# Patient Record
Sex: Female | Born: 1985 | Race: White | Hispanic: No | Marital: Single | Smoking: Former smoker
Health system: Southern US, Community
[De-identification: ages and names within clinical notes are randomized; demographics above are authoritative.]

## PROBLEM LIST (undated history)

## (undated) DIAGNOSIS — Z8719 Personal history of other diseases of the digestive system: Secondary | ICD-10-CM

## (undated) DIAGNOSIS — K429 Umbilical hernia without obstruction or gangrene: Secondary | ICD-10-CM

## (undated) HISTORY — PX: HERNIA REPAIR: SHX51

---
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
## 2011-04-11 ENCOUNTER — Emergency Department: Payer: Self-pay | Admitting: Emergency Medicine

## 2012-07-07 DIAGNOSIS — K429 Umbilical hernia without obstruction or gangrene: Secondary | ICD-10-CM

## 2012-07-07 HISTORY — DX: Umbilical hernia without obstruction or gangrene: K42.9

## 2018-11-11 DIAGNOSIS — Z8742 Personal history of other diseases of the female genital tract: Secondary | ICD-10-CM | POA: Insufficient documentation

## 2018-11-11 LAB — HM PAP SMEAR: HM Pap smear: POSITIVE

## 2018-11-11 LAB — HM HIV SCREENING LAB: HM HIV Screening: NEGATIVE

## 2018-11-11 LAB — OB RESULTS CONSOLE HIV ANTIBODY (ROUTINE TESTING): HIV: NONREACTIVE

## 2018-11-12 ENCOUNTER — Other Ambulatory Visit: Payer: Self-pay | Admitting: Family Medicine

## 2018-11-12 DIAGNOSIS — Z3689 Encounter for other specified antenatal screening: Secondary | ICD-10-CM

## 2018-11-12 LAB — OB RESULTS CONSOLE HEPATITIS B SURFACE ANTIGEN: Hepatitis B Surface Ag: NEGATIVE

## 2018-11-12 LAB — OB RESULTS CONSOLE RPR: RPR: NONREACTIVE

## 2018-11-12 LAB — OB RESULTS CONSOLE RUBELLA ANTIBODY, IGM: Rubella: IMMUNE

## 2018-11-12 LAB — OB RESULTS CONSOLE VARICELLA ZOSTER ANTIBODY, IGG: Varicella: IMMUNE

## 2018-11-16 LAB — OB RESULTS CONSOLE GC/CHLAMYDIA
Chlamydia: NEGATIVE
Gonorrhea: NEGATIVE

## 2018-11-22 ENCOUNTER — Other Ambulatory Visit: Payer: Self-pay

## 2018-11-22 DIAGNOSIS — O0993 Supervision of high risk pregnancy, unspecified, third trimester: Secondary | ICD-10-CM

## 2018-11-25 ENCOUNTER — Other Ambulatory Visit: Payer: Self-pay

## 2018-11-25 ENCOUNTER — Other Ambulatory Visit: Payer: Self-pay | Admitting: Family Medicine

## 2018-11-25 ENCOUNTER — Ambulatory Visit
Admission: RE | Admit: 2018-11-25 | Discharge: 2018-11-25 | Disposition: A | Discharge status: Home / Self Care | Payer: Medicaid Other | Source: Ambulatory Visit | Attending: Family Medicine | Admitting: Family Medicine

## 2018-11-25 DIAGNOSIS — Z3689 Encounter for other specified antenatal screening: Secondary | ICD-10-CM | POA: Insufficient documentation

## 2018-11-25 DIAGNOSIS — O0930 Supervision of pregnancy with insufficient antenatal care, unspecified trimester: Secondary | ICD-10-CM

## 2018-11-25 DIAGNOSIS — O0933 Supervision of pregnancy with insufficient antenatal care, third trimester: Secondary | ICD-10-CM | POA: Diagnosis not present

## 2018-11-25 DIAGNOSIS — Z3A36 36 weeks gestation of pregnancy: Secondary | ICD-10-CM | POA: Diagnosis not present

## 2018-12-02 ENCOUNTER — Other Ambulatory Visit: Payer: Self-pay

## 2018-12-02 DIAGNOSIS — O0993 Supervision of high risk pregnancy, unspecified, third trimester: Secondary | ICD-10-CM

## 2018-12-04 LAB — OB RESULTS CONSOLE GBS: GBS: NEGATIVE

## 2018-12-06 ENCOUNTER — Other Ambulatory Visit: Payer: Self-pay

## 2018-12-06 ENCOUNTER — Ambulatory Visit
Admission: RE | Admit: 2018-12-06 | Discharge: 2018-12-06 | Disposition: A | Discharge status: Home / Self Care | Payer: Medicaid Other | Source: Ambulatory Visit | Attending: Obstetrics and Gynecology | Admitting: Obstetrics and Gynecology

## 2018-12-06 DIAGNOSIS — O0993 Supervision of high risk pregnancy, unspecified, third trimester: Secondary | ICD-10-CM | POA: Diagnosis not present

## 2018-12-06 DIAGNOSIS — Z6791 Unspecified blood type, Rh negative: Secondary | ICD-10-CM | POA: Insufficient documentation

## 2018-12-06 DIAGNOSIS — O4103X0 Oligohydramnios, third trimester, not applicable or unspecified: Secondary | ICD-10-CM

## 2018-12-06 DIAGNOSIS — O26899 Other specified pregnancy related conditions, unspecified trimester: Secondary | ICD-10-CM

## 2018-12-06 DIAGNOSIS — Z3A37 37 weeks gestation of pregnancy: Secondary | ICD-10-CM | POA: Insufficient documentation

## 2018-12-06 DIAGNOSIS — O0933 Supervision of pregnancy with insufficient antenatal care, third trimester: Secondary | ICD-10-CM

## 2018-12-06 DIAGNOSIS — O0930 Supervision of pregnancy with insufficient antenatal care, unspecified trimester: Secondary | ICD-10-CM | POA: Insufficient documentation

## 2018-12-06 DIAGNOSIS — O321XX0 Maternal care for breech presentation, not applicable or unspecified: Secondary | ICD-10-CM

## 2018-12-06 NOTE — Progress Notes (Signed)
Pt is 37 6/7 and BREECH  Dates by 36 week scan here Limited prenatal care after her move form [place] - her mother lives here and her daughter is here too ( that baby was a SVD - pt says she was breech and flipped )  Pt has not made contact with an OB provider beyond [facility]  Given low AFI and breech position I recommended she identify a group  this week and have a repeat AFI , if no 2x2 pocket,  consider proceeding with primary cesarean . The nurse assisted her in finding an appointment . Pt reports she is hydrating well an d does not have any fluid leakage  Pos FM  Pt received rhophylac  Other issues advanced paternal age 70 ( he's in [place] but still together per [facility] notes) and h/o marijuana use in [place] if GBS has been done?  Jimmey Ralph

## 2018-12-07 ENCOUNTER — Encounter: Payer: Self-pay | Admitting: Obstetrics and Gynecology

## 2018-12-07 ENCOUNTER — Ambulatory Visit (INDEPENDENT_AMBULATORY_CARE_PROVIDER_SITE_OTHER): Payer: Self-pay | Admitting: Obstetrics and Gynecology

## 2018-12-07 ENCOUNTER — Other Ambulatory Visit: Payer: MEDICAID

## 2018-12-07 VITALS — BP 120/70 | Wt 187.0 lb

## 2018-12-07 DIAGNOSIS — Z3A38 38 weeks gestation of pregnancy: Secondary | ICD-10-CM

## 2018-12-07 DIAGNOSIS — O4100X0 Oligohydramnios, unspecified trimester, not applicable or unspecified: Secondary | ICD-10-CM

## 2018-12-07 DIAGNOSIS — O4103X0 Oligohydramnios, third trimester, not applicable or unspecified: Secondary | ICD-10-CM

## 2018-12-07 DIAGNOSIS — O321XX0 Maternal care for breech presentation, not applicable or unspecified: Secondary | ICD-10-CM

## 2018-12-07 DIAGNOSIS — O0933 Supervision of pregnancy with insufficient antenatal care, third trimester: Secondary | ICD-10-CM

## 2018-12-07 NOTE — Progress Notes (Signed)
[facility] Delivery plan  C/o a lot of pain

## 2018-12-07 NOTE — Progress Notes (Signed)
12/07/2018   Chief Complaint: Missed period  Transfer of Care Patient: yes  History of Present Illness: Madison Bailey is a 33 y.o. G2P0 [redacted]w[redacted]d based on Patient's last menstrual period was 04/13/2018. with an Estimated Date of Delivery: 12/21/18, with the above CC. Patient presents today as a delivery consultation for limited prenatal care, breech presentation and low AFI with sufficient  2x2 pocket.   ROS:  Review of Systems  Constitutional: Negative for chills, fever, malaise/fatigue and weight loss.  HENT: Negative for congestion, hearing loss and sinus pain.   Eyes: Negative for blurred vision and double vision.  Respiratory: Negative for cough, sputum production, shortness of breath and wheezing.   Cardiovascular: Negative for chest pain, palpitations, orthopnea and leg swelling.  Gastrointestinal: Negative for abdominal pain, constipation, diarrhea, nausea and vomiting.  Genitourinary: Negative for dysuria, flank pain, frequency, hematuria and urgency.  Musculoskeletal: Negative for back pain, falls and joint pain.  Skin: Negative for itching and rash.  Neurological: Negative for dizziness and headaches.  Psychiatric/Behavioral: Negative for depression, substance abuse and suicidal ideas. The patient is not nervous/anxious.    Clinic [facility] Prenatal Labs  Dating  36 wk [place] Blood type: --/--/A NEG (06/09 0902)   Genetic Screen  late to care, not performed Antibody:POS (06/09 0902)  Anatomic [place] complete Rubella: Immune (05/08 0000) Varicella: Immune  GTT  28 wk:  84   RPR: Nonreactive (05/08 0000)   Rhogam  patient reports she was given this inpatient at another facility at [redacted] wks gestation HBsAg: Negative (05/08 0000)   TDaP vaccine    11/11/2018                   HIV: Non-reactive (05/07 0000)   Flu Shot     Declined                         SKA:[id number] (05/30 0000)  Contraception  Pap: ASCUS HR HPV+, colposcopy needed postpartum  CBB     CS/VBAC  Hx vaginal deliver   Baby Food   Breast   Support Person       OBGYN History: As per HPI. OB History  Gravida Para Term Preterm AB Living  2         1  SAB TAB Ectopic Multiple Live Births               # Outcome Date GA Lbr Len/2nd Weight Sex Delivery Anes PTL Lv  2 Current           1 Gravida             Any issues with any prior pregnancies: no Any prior children are healthy, doing well, without any problems or issues: yes History of pap smears: Yes. Last pap smear  Abnormal: yes ASCUS HR HPV + History of STIs: Yes   Past Medical History: Past Medical History:  Diagnosis Date  . Hernia, umbilical 2014    Past Surgical History: Past Surgical History:  Procedure Laterality Date  . HERNIA REPAIR      Family History:  History reviewed. No pertinent family history. She denies any female cancers, bleeding or blood clotting disorders.  She denies any history of mental retardation, birth defects or genetic disorders in her or the FOB's history  Social History:  Social History   Socioeconomic History  . Marital status: Single    Spouse name: Not on file  . Number of children: Not on file  .  Years of education: Not on file  . Highest education level: Not on file  Occupational History  . Not on file  Social Needs  . Financial resource strain: Not on file  . Food insecurity:    Worry: Not on file    Inability: Not on file  . Transportation needs:    Medical: Not on file    Non-medical: Not on file  Tobacco Use  . Smoking status: Never Smoker  . Smokeless tobacco: Never Used  Substance and Sexual Activity  . Alcohol use: Not Currently  . Drug use: Yes    Types: Marijuana  . Sexual activity: Not Currently  Lifestyle  . Physical activity:    Days per week: Not on file    Minutes per session: Not on file  . Stress: Not on file  Relationships  . Social connections:    Talks on phone: Not on file    Gets together: Not on file    Attends religious service: Not on file    Active member of  club or organization: Not on file    Attends meetings of clubs or organizations: Not on file    Relationship status: Not on file  . Intimate partner violence:    Fear of current or ex partner: Not on file    Emotionally abused: Not on file    Physically abused: Not on file    Forced sexual activity: Not on file  Other Topics Concern  . Not on file  Social History Narrative  . Not on file    Allergy: No Known Allergies  Current Outpatient Medications:  Current Outpatient Medications:  .  Prenatal Vit-Fe Fumarate-FA (PRENATAL MULTIVITAMIN) TABS tablet, Take 1 tablet by mouth daily at 12 noon., Disp: , Rfl:    Physical Exam: Physical Exam     Assessment: Madison Bailey is a 33 y.o. G2P0 [redacted]w[redacted]d based on Patient's last menstrual period was 04/13/2018. with an Estimated Date of Delivery: 12/21/18,  for prenatal care.  Plan:  1) Avoid alcoholic beverages. 2) Patient encouraged not to smoke.  3) Discontinue the use of all non-medicinal drugs and chemicals.  4) Take prenatal vitamins daily.  5) Seatbelt use advised 6) Nutrition, food safety (fish, cheese advisories, and high nitrite foods) and exercise discussed. 7) Hospital and practice style delivering at [facility] discussed  8) Patient is asked about travel to areas at risk for the Zika virus, and counseled to avoid travel and exposure to mosquitoes or sexual partners who may have themselves been exposed to the virus. Testing is discussed, and will be ordered as appropriate.  9) Childbirth classes at [facility] advised 10) Genetic Screening, such as with 1st Trimester Screening, cell free fetal DNA, AFP testing, and Ultrasound, as well as with amniocentesis and CVS as appropriate, is discussed with patient. She plans to not have genetic testing this pregnancy.    Patient has a low AFI, but a sufficient 2x2 pocket. Transportation is a concern for her. Will have her return to the hospital Thursday for NST/AFI followed by possible version or  possible cesarean section depending on the fluid level and NST.   This plan was discussed and confirmed with Dr. Leatha Gilding  Problem list reviewed and updated.  I discussed the assessment and treatment plan with the patient. The patient was provided an opportunity to ask questions and all were answered. The patient agreed with the plan and demonstrated an understanding of the instructions.   The patient was advised to call back or  seek an in-person evaluation if the symptoms worsen or if the condition fails to improve as anticipated.  I provided 45 minutes of face-to-face time during this encounter.  Adelene Idlerhristanna Kemani Heidel MD [facility], [facility] 12/07/2018 12:36 PM

## 2018-12-09 ENCOUNTER — Observation Stay
Admission: RE | Admit: 2018-12-09 | Discharge: 2018-12-09 | Disposition: A | Discharge status: Home / Self Care | Payer: Medicaid Other | Attending: Obstetrics and Gynecology | Admitting: Obstetrics and Gynecology

## 2018-12-09 ENCOUNTER — Other Ambulatory Visit: Payer: Self-pay

## 2018-12-09 ENCOUNTER — Observation Stay: Payer: Medicaid Other

## 2018-12-09 ENCOUNTER — Telehealth: Payer: Self-pay

## 2018-12-09 ENCOUNTER — Inpatient Hospital Stay: Admission: RE | Admit: 2018-12-09 | Payer: Self-pay | Source: Ambulatory Visit

## 2018-12-09 DIAGNOSIS — Z1159 Encounter for screening for other viral diseases: Secondary | ICD-10-CM | POA: Diagnosis not present

## 2018-12-09 DIAGNOSIS — O321XX0 Maternal care for breech presentation, not applicable or unspecified: Secondary | ICD-10-CM | POA: Diagnosis present

## 2018-12-09 DIAGNOSIS — O0933 Supervision of pregnancy with insufficient antenatal care, third trimester: Secondary | ICD-10-CM | POA: Insufficient documentation

## 2018-12-09 DIAGNOSIS — Z3A38 38 weeks gestation of pregnancy: Secondary | ICD-10-CM | POA: Diagnosis not present

## 2018-12-09 DIAGNOSIS — O4100X0 Oligohydramnios, unspecified trimester, not applicable or unspecified: Secondary | ICD-10-CM

## 2018-12-09 LAB — CBC
HCT: 31.3 % — ABNORMAL LOW (ref 36.0–46.0)
Hemoglobin: 10.7 g/dL — ABNORMAL LOW (ref 12.0–15.0)
MCH: 31.8 pg (ref 26.0–34.0)
MCHC: 34.2 g/dL (ref 30.0–36.0)
MCV: 93.2 fL (ref 80.0–100.0)
Platelets: 213 10*3/uL (ref 150–400)
RBC: 3.36 MIL/uL — ABNORMAL LOW (ref 3.87–5.11)
RDW: 14.8 % (ref 11.5–15.5)
WBC: 10.8 10*3/uL — ABNORMAL HIGH (ref 4.0–10.5)
nRBC: 0 % (ref 0.0–0.2)

## 2018-12-09 LAB — SARS CORONAVIRUS 2 BY RT PCR (HOSPITAL ORDER, PERFORMED IN ~~LOC~~ HOSPITAL LAB): SARS Coronavirus 2: NEGATIVE

## 2018-12-09 LAB — ABO/RH: ABO/RH(D): A NEG

## 2018-12-09 MED ORDER — LACTATED RINGERS IV SOLN: INTRAVENOUS | Status: DC

## 2018-12-09 MED ORDER — SOD CITRATE-CITRIC ACID 500-334 MG/5ML PO SOLN
ORAL | Status: AC
  Filled 2018-12-09: qty 30

## 2018-12-09 NOTE — Progress Notes (Signed)
Patient scheduled for cesarean section on 12/14/2018. Will follow up on Sunday for a NST Says that she has a ride for these events.  NST reactive Will discharge home  Adelene Idler MD [facility], [facility] 12/09/2018 8:01 PM

## 2018-12-09 NOTE — Discharge Instructions (Signed)
Oligohydramnios Oligohydramnios is a condition in which there is not enough fluid in the sac (amniotic sac) that surrounds your unborn baby (fetus) in the uterus. The amniotic sac contains fluid (amniotic fluid) that:  Protects your baby from injury (trauma) and infections.  Helps your baby move freely inside the uterus.  Helps your baby's lungs, kidneys, and digestive system to develop. This condition can interfere with your baby's normal prenatal growth and development. It can happen any time during pregnancy but is most common during the last 3 months (third trimester). Oligohydramnios is most likely to cause serious problems when it occurs early in pregnancy. Some problems that can result from this condition include:  Early (premature) birth.  Birth defects.  Limited (restricted) fetal growth.  Decreased oxygen flow to the fetus due to pressure on the umbilical cord.  Pregnancy loss (miscarriage).  Stillbirth. What are the causes? This condition may be caused by:  A leak or tear in the amniotic sac.  A problem with the organ that nourishes the baby in the uterus (placenta), such as failure of the placenta to provide enough blood, fluid, and nutrients to the baby.  Having identical twins who share the same placenta.  A fetal birth defect. This is usually an abnormality in the fetal kidneys or urinary tract.  A pregnancy that goes 2 weeks or more past the due date.  A condition that the mother has, such as: ? A lack of fluids in the body (dehydration). ? High blood pressure. ? Diabetes. ? Reactions to certain medicines, such as ibuprofen or blood pressure medicines (ACE inhibitors). ? Systemic lupus. In some cases, the cause is not known. What increases the risk? This condition is more likely to develop if you:  Become dehydrated.  Have high blood pressure.  Take NSAIDs or ACE inhibitors.  Have diabetes or lupus.  Have poor prenatal care.  Have a clotting  disorder. What are the signs or symptoms? In most cases, there are no symptoms of oligohydramnios. If you do have symptoms, they may include:  Fluid leaking from the vagina.  Having a uterus that is smaller than normal.  Feeling less movement of your baby in your uterus. How is this diagnosed? This condition may be diagnosed by measuring the amount of amniotic fluid in your amniotic sac using the amniotic fluid index (AFI). The AFI is a measurement that is done during a prenatal ultrasound test that uses painless, harmless sound waves to create an image of your uterus and your baby. This prenatal ultrasound may be done to:  Measure the amniotic fluid level.  Check your baby's kidneys and your baby's growth.  Evaluate the placenta. You may also have other tests to find the cause of oligohydramnios. How is this treated? Treatment for this condition depends on how low your amniotic fluid level is, how far along you are in your pregnancy, and your overall health. Treatment may include:  Having your health care provider monitor your condition more closely than usual. You may have more frequent appointments and more AFI ultrasound measurements.  Increasing the amount of fluid in your body. This may be done by having you drink more fluids or by giving you fluids through an IV that is inserted into one of your veins.  Injecting fluid into your amniotic sac during delivery (amnioinfusion).  Having your baby delivered early, if you are close to your due date. Follow these instructions at home: Lifestyle  Do not drink alcohol. No amount of alcohol  is safe during pregnancy.  Do not use any products that contain nicotine or tobacco, such as cigarettes, e-cigarettes, and chewing tobacco. If you need help quitting, ask your health care provider.  Do not use any illegal drugs. These can harm your developing baby or cause a miscarriage. General instructions      Take over-the-counter and  prescription medicines only as told by your health care provider.  Follow instructions from your health care provider about physical activity and rest. Your health care provider may recommend that you stay in bed (be on bed rest).  Follow instructions from your health care provider about eating or drinking restrictions.  Eat healthy foods, including a balance of fruits and vegetables, lean proteins, whole grains, and low-fat or nonfat dairy products.  Drink enough fluid to keep your urine pale yellow.  Keep all prenatal care appointments with your health care provider. This is important. Contact a health care provider if:  You notice that your baby seems to be moving less than usual. Get help right away if:  You have fluid leaking from your vagina.  You start to have labor pains (contractions). This may feel like a sense of tightening in your lower abdomen.  You have a fever. Summary  Oligohydramnios is a condition in which there is not enough fluid in the amniotic sac that surrounds your unborn baby in the uterus. It can interfere with your baby's normal growth and development.  This condition is most common in the last 3 months of pregnancy but can occur at any time. If it occurs early in pregnancy, oligohydramnios can lead to serious problems.  Do not drink alcohol or use nicotine, tobacco, or illegal drugs.  Keep your regular prenatal appointments, eat healthy, and drink enough fluid to keep your urine pale yellow.  Contact your health care provider if you notice that your baby seems to be moving less than usual. Get help right away if you have fluid leaking from your vagina, you sense a tightening in your lower abdomen, or you have a fever. This information is not intended to replace advice given to you by your health care provider. Make sure you discuss any questions you have with your health care provider. Document Released: 10/08/2010 Document Revised: 12/02/2017 Document  Reviewed: 12/02/2017 Elsevier Interactive Patient Education  [employer]2019 Else[employer] Inc. [place]. Return 6/9 for your scheduled C/S. You will be called with the time to arrive. The day of surgery shower with surgical soap and wipe abdomen with provided wipes. Call Labor and Delivery with any questions or concerns [phone]  Call provider or return to birthplace with:  1. Strong regular contractions every 5 minutes. 2. Leaking of fluid from your vagina 3. Vaginal bleeding: Bright red or heavy like a period 4. Decreased Fetal movement

## 2018-12-09 NOTE — H&P (Signed)
History and Physical  Madison Bailey is an 33 y.o. female.  HPI: Patient presents today from home. She has been seen for late prenatal care. She was found to have low amniotic fluid this week, but had a sufficient amount of fluid (2x2cm pocket). She presented today for a NST and AFI. The fluid level today was again low but sufficient and a 2x3 cm pocket was seen. Her baby is breech. She previously had a breech infant but underwent a successful version. She would like to attempt another version.   NST: 120 bpm baseline, moderate variability, 15x15 accelerations, no decelerations. Tocometer : quiet  Past Medical History:  Diagnosis Date  . Hernia, umbilical 2014    Past Surgical History:  Procedure Laterality Date  . HERNIA REPAIR      No family history on file.  Social History:  reports that she has never smoked. She has never used smokeless tobacco. She reports previous alcohol use. She reports current drug use. Drug: Marijuana.  Allergies: No Known Allergies  Medications: I have reviewed the patient's current medications.  Results for orders placed or performed during the hospital encounter of 12/09/18 (from the past 48 hour(s))  CBC     Status: Abnormal   Collection Time: 12/09/18  8:21 AM  Result Value Ref Range   WBC 10.8 (H) 4.0 - 10.5 K/uL   RBC 3.36 (L) 3.87 - 5.11 MIL/uL   Hemoglobin 10.7 (L) 12.0 - 15.0 g/dL   HCT 36.1 (L) 44.3 - 15.4 %   MCV 93.2 80.0 - 100.0 fL   MCH 31.8 26.0 - 34.0 pg   MCHC 34.2 30.0 - 36.0 g/dL   RDW 00.8 67.6 - 19.5 %   Platelets 213 150 - 400 K/uL   nRBC 0.0 0.0 - 0.2 %    Comment: Performed at [facility], [address] [place], [place] [address]  Type and screen [facility]     Status: None (Preliminary result)   Collection Time: 12/09/18  8:21 AM  Result Value Ref Range   ABO/RH(D) A NEG    Antibody Screen POS    Sample Expiration 12/12/2018,2359    Antibody Identification PASSIVELY ACQUIRED ANTI-D     Unit Number [id number]    Blood Component Type RED CELLS,LR    Unit division 00    Status of Unit ALLOCATED    Transfusion Status OK TO TRANSFUSE    Crossmatch Result      COMPATIBLE Performed at [facility], [address] [place], [place] [address]    Unit Number [id number]    Blood Component Type RBC LR PHER2    Unit division 00    Status of Unit ALLOCATED    Transfusion Status OK TO TRANSFUSE    Crossmatch Result COMPATIBLE   SARS Coronavirus 2 (CEPHEID - Performed in [facility] hospital lab), Hosp Order     Status: None   Collection Time: 12/09/18  8:21 AM  Result Value Ref Range   SARS Coronavirus 2 NEGATIVE NEGATIVE    Comment: (NOTE) If result is NEGATIVE SARS-CoV-2 target nucleic acids are NOT DETECTED. The SARS-CoV-2 RNA is generally detectable in upper and lower  respiratory specimens during the acute phase of infection. The lowest  concentration of SARS-CoV-2 viral copies this assay can detect is 250  copies / mL. A negative result does not preclude SARS-CoV-2 infection  and should not be used as the sole basis for treatment or other  patient management decisions.  A  negative result may occur with  improper specimen collection / handling, submission of specimen other  than nasopharyngeal swab, presence of viral mutation(s) within the  areas targeted by this assay, and inadequate number of viral copies  (<250 copies / mL). A negative result must be combined with clinical  observations, patient history, and epidemiological information. If result is POSITIVE SARS-CoV-2 target nucleic acids are DETECTED. The SARS-CoV-2 RNA is generally detectable in upper and lower  respiratory specimens dur ing the acute phase of infection.  Positive  results are indicative of active infection with SARS-CoV-2.  Clinical  correlation with patient history and other diagnostic information is  necessary to determine patient infection status.  Positive results do   not rule out bacterial infection or co-infection with other viruses. If result is PRESUMPTIVE POSTIVE SARS-CoV-2 nucleic acids MAY BE PRESENT.   A presumptive positive result was obtained on the submitted specimen  and confirmed on repeat testing.  While 2019 novel coronavirus  (SARS-CoV-2) nucleic acids may be present in the submitted sample  additional confirmatory testing may be necessary for epidemiological  and / or clinical management purposes  to differentiate between  SARS-CoV-2 and other Sarbecovirus currently known to infect humans.  If clinically indicated additional testing with an alternate test  methodology [id number]) is advised. The SARS-CoV-2 RNA is generally  detectable in upper and lower respiratory sp ecimens during the acute  phase of infection. The expected result is Negative. Fact Sheet for Patients:  BoilerBrush.com.cyhttps://www.fda.gov/media/136312/download Fact Sheet for Healthcare Providers: https://pope.com/https://www.fda.gov/media/136313/download This test is not yet approved or cleared by the [place] FDA and has been authorized for detection and/or diagnosis of SARS-CoV-2 by FDA under an Emergency Use Authorization (EUA).  This EUA will remain in effect (meaning this test can be used) for the duration of the COVID-19 declaration under Section 564(b)(1) of the Act, 21 U.S.C. section 360bbb-3(b)(1), unless the authorization is terminated or revoked sooner. Performed at [facility], [address] [place], [place] [address]   ABO/Rh     Status: None   Collection Time: 12/09/18  1:52 PM  Result Value Ref Range   ABO/RH(D)      A NEG Performed at [facility], [address] [place], [place] [address]     [place] Ob Limited  Result Date: 12/09/2018 CLINICAL DATA:  AFI valuation. EXAM: LIMITED OBSTETRIC ULTRASOUND FINDINGS: Number of Fetuses: 1 Heart Rate:  123 bpm Movement: Present Presentation: Breech Placental Location: Anterior Previa: No Amniotic Fluid  (Subjective):  Low AFI: 5.2 cm.  Similar finding noted on prior exam of 12/06/2018. Femur length 7.6 cm gestational age [redacted] weeks 0 days. MATERNAL FINDINGS: Cervix:  Not visualized Uterus/Adnexae: No abnormality visualized. IMPRESSION: 1. Single viable intrauterine pregnancy at 39 weeks 0 days. Fetal heart rate 123 beats per minute. Breech presentation. 2.  Amniotic fluid is subjectively low.  AFI at 5.2 cm. These results will be called to the ordering clinician or representative by the Radiologist Assistant, and communication documented in the PACS or zVision Dashboard. This exam is performed on an emergent basis and does not comprehensively evaluate fetal size, dating, or anatomy; follow-up complete OB US should be considered if further fetal assessment is warranted. Electronically Signed   By: Maisie Fushomas  Register   On: 12/09/2018 11:56    Review of Systems  Constitutional: Negative for chills, fever, malaise/fatigue and weight loss.  HENT: Negative for congestion, hearing loss and sinus pain.   Eyes: Negative for blurred vision and double vision.  Respiratory: Negative for cough, sputum production, shortness of breath and wheezing.   Cardiovascular: Negative for chest pain, palpitations, orthopnea and leg swelling.  Gastrointestinal: Negative for abdominal pain, constipation, diarrhea, nausea and vomiting.  Genitourinary: Negative for dysuria, flank pain, frequency, hematuria and urgency.  Musculoskeletal: Negative for back pain, falls and joint pain.  Skin: Negative for itching and rash.  Neurological: Negative for dizziness and headaches.  Psychiatric/Behavioral: Negative for depression, substance abuse and suicidal ideas. The patient is not nervous/anxious.    Blood pressure 117/65, pulse 78, temperature 98.1 F (36.7 C), temperature source Oral, resp. rate 16, height  (1.676 m), weight 84 kg, last menstrual period 04/13/2018. Physical Exam  Nursing note and vitals  reviewed. Constitutional: She is oriented to person, place, and time. She appears well-developed and well-nourished.  HENT:  Head: Normocephalic and atraumatic.  Cardiovascular: Normal rate and regular rhythm.  Respiratory: Effort normal and breath sounds normal.  GI: Soft. Bowel sounds are normal.  Musculoskeletal: Normal range of motion.  Neurological: She is alert and oriented to person, place, and time.  Skin: Skin is warm and dry.  Psychiatric: She has a normal mood and affect. Her behavior is normal. Judgment and thought content normal.    Assessment/Plan: 33 yo G2P1001 [redacted]w[redacted]d Will attempt version for breech position. Patient counseled on risks benefits and alternatives. She understands the risk on nonreassuring fetal heart tones and placental abruption that could necessitate emergent delivery.  She would like to proceed.    [name] 12/09/2018, 5:16 PM

## 2018-12-09 NOTE — Procedures (Signed)
External Cephalic Version  After informed consent, including reviewing the potential risks of pain, failure to turn fetus,  rupture of membranes, uterine abruption, bleeding and fetal death.  Patient opted to proceed with the procedure.  Ultrasound at bedside confirms breech presentation. ECV was attempted under Ultrasound guidance.   The version was unsuccessful.  FHR was 125 and reactive before and after the procedure.  Toco showed no uterine contractions Pt. Tolerated the procedure well.  There were no complications and the pt will be discharged to home with abdominal binder after  1 -2 hours of fetal and uterine monitoring.  Adelene Idler MD [facility], [facility] 12/09/2018 6:53 PM

## 2018-12-09 NOTE — Progress Notes (Signed)
OR notified we are going to proceed with version in [facility]

## 2018-12-09 NOTE — Telephone Encounter (Signed)
Late Entry: Diane from [facility] calling to give u/s results on pt and to make sure we could see results in pt's chart.  Limited OB u/s: Breech Presentation.  See also u/s results under the imaging tab.  She asked that CRS be notified ASAP.  This was around 11:45am.  I notified JYS who is covering L&D this am as pt is in L&D as well and CRS in OR today.  JYS will notify CRS.

## 2018-12-09 NOTE — Discharge Summary (Signed)
Physician Discharge Summary   Patient ID: Madison Bailey [id number] 33 y.o. [birth year]  Admit date: 12/09/2018  Discharge date and time: No discharge date for patient encounter.   Admitting Physician: Natale Milch, MD   Discharge Physician: Adelene Idler MD  Admission Diagnoses: Homero Fellers breech presentation, low amniotic fluid  Discharge Diagnoses: same as above  Admission Condition: good  Discharged Condition: good  Indication for Admission: Patient was admitted for NST/ AFI and external cephalic version.  Hospital Course: NST was reactive and AFI was within normal limits with a 3cm MVP. External cephalic version was attempted but was not successful. She was scheduled for a cesarean section on 12/14/2018. She will return for a NST on Sunday.   Consults: None  Significant Diagnostic Studies: radiology: Ultrasound: see report  Treatments: IV hydration  Discharge Exam: BP 117/65   Pulse 78   Temp 98.1 F (36.7 C) (Oral)   Resp 16   Ht 5\' 6"  (1.676 m)   Wt 84 kg   LMP 04/13/2018   BMI 29.89 kg/m   General Appearance:    Alert, cooperative, no distress, appears stated age  Head:    Normocephalic, without obvious abnormality, atraumatic  Eyes:    PERRL, conjunctiva/corneas clear, EOM's intact, fundi    benign, both eyes  Ears:    Normal TM's and external ear canals, both ears  Nose:   Nares normal, septum midline, mucosa normal, no drainage    or sinus tenderness  Throat:   Lips, mucosa, and tongue normal; teeth and gums normal  Neck:   Supple, symmetrical, trachea midline, no adenopathy;    thyroid:  no enlargement/tenderness/nodules; no carotid   bruit or JVD  Back:     Symmetric, no curvature, ROM normal, no CVA tenderness  Lungs:     Clear to auscultation bilaterally, respirations unlabored  Chest Wall:    No tenderness or deformity   Heart:    Regular rate and rhythm, S1 and S2 normal, no murmur, rub   or gallop  Breast Exam:    No tenderness, masses,  or nipple abnormality  Abdomen:     Soft, non-tender, bowel sounds active all four quadrants,    no masses, no organomegaly  Genitalia:    Normal female without lesion, discharge or tenderness  Rectal:    Normal tone, normal prostate, no masses or tenderness;   guaiac negative stool  Extremities:   Extremities normal, atraumatic, no cyanosis or edema  Pulses:   2+ and symmetric all extremities  Skin:   Skin color, texture, turgor normal, no rashes or lesions  Lymph nodes:   Cervical, supraclavicular, and axillary nodes normal  Neurologic:   CNII-XII intact, normal strength, sensation and reflexes    throughout    Disposition: Discharge disposition: 01-Home or Self Care       Patient Instructions:  Allergies as of 12/09/2018   No Known Allergies     Medication List    TAKE these medications   prenatal multivitamin Tabs tablet Take 1 tablet by mouth daily at 12 noon.      Activity: activity as tolerated Diet: regular diet Wound Care: none needed  Follow-up with Labor and delivery in 3 days.  Signed: Natale Milch 12/09/2018 8:07 PM

## 2018-12-11 ENCOUNTER — Observation Stay
Admission: EM | Admit: 2018-12-11 | Discharge: 2018-12-11 | Disposition: A | Discharge status: Home / Self Care | Payer: Medicaid Other | Attending: Obstetrics & Gynecology | Admitting: Obstetrics & Gynecology

## 2018-12-11 ENCOUNTER — Other Ambulatory Visit: Payer: Self-pay

## 2018-12-11 DIAGNOSIS — Z3A38 38 weeks gestation of pregnancy: Secondary | ICD-10-CM | POA: Diagnosis not present

## 2018-12-11 DIAGNOSIS — O479 False labor, unspecified: Secondary | ICD-10-CM | POA: Diagnosis present

## 2018-12-11 DIAGNOSIS — O471 False labor at or after 37 completed weeks of gestation: Secondary | ICD-10-CM

## 2018-12-11 LAB — TYPE AND SCREEN
ABO/RH(D): A NEG
Antibody Screen: POSITIVE
Unit division: 0
Unit division: 0

## 2018-12-11 LAB — BPAM RBC
Blood Product Expiration Date: 202006302359
Blood Product Expiration Date: 202006302359
Unit Type and Rh: 9500
Unit Type and Rh: 9500

## 2018-12-11 MED ORDER — LIDOCAINE HCL (PF) 1 % IJ SOLN: 30.0000 mL | INTRAMUSCULAR | Status: DC | PRN

## 2018-12-11 MED ORDER — ONDANSETRON HCL 4 MG/2ML IJ SOLN: 4.0000 mg | Freq: Four times a day (QID) | INTRAMUSCULAR | Status: DC | PRN

## 2018-12-11 MED ORDER — ACETAMINOPHEN 325 MG PO TABS: 650.0000 mg | ORAL_TABLET | ORAL | Status: DC | PRN

## 2018-12-11 NOTE — Final Progress Note (Signed)
Physician Final Progress Note  Patient ID: Madison Bailey MRN: [id number] DOB/AGE: March 30, 1986 33 y.o.  Admit date: 12/11/2018 Admitting provider: Gae Dry, MD Discharge date: 12/11/2018  Admission Diagnoses: Pt is 38 weeks, Painful contractions, leakage of lfuid, decreased fetal movements  Discharge Diagnoses:  Active Problems:   Braxton Hicks contractions No s/sx PROM or Labor Breech presentation  Consults: None  Significant Findings/ Diagnostic Studies:  Obstetrics Admission History & Physical   Decreased Fetal Movement   HPI:  33 y.o. G2P1001 @ [redacted]w[redacted]d (12/21/2018, by Ultrasound). Admitted on 12/11/2018:   Patient Active Problem List   Diagnosis Date Noted  . Braxton Hicks contractions 12/11/2018  . Low amniotic fluid 12/09/2018  . Late prenatal care affecting pregnancy 12/06/2018  . Rh negative state in antepartum period 12/06/2018  . Breech presentation of fetus 12/06/2018  . Oligohydramnios antepartum, third trimester, not applicable or unspecified fetus 12/06/2018     Presents for painful lower abdominal pain as well as leakage of fluid at [place] today; also decreased fetal movemnent.  Pt is known to be breech and has CS scheduled for next week.  ECV not successful last week.   Prenatal care at: at [facility]. Pregnancy complicated by Breech.  ROS: A review of systems was performed and negative, except as stated in the above HPI. PMHx:  Past Medical History:  Diagnosis Date  . Hernia, umbilical 2409   PSHx:  Past Surgical History:  Procedure Laterality Date  . HERNIA REPAIR     Medications:  Medications Prior to Admission  Medication Sig Dispense Refill Last Dose  . Prenatal Vit-Fe Fumarate-FA (PRENATAL MULTIVITAMIN) TABS tablet Take 1 tablet by mouth daily at 12 noon.   12/11/2018 at Unknown time   Allergies: has No Known Allergies. OBHx:  OB History  Gravida Para Term Preterm AB Living  2 1 1     1   SAB TAB Ectopic Multiple Live Births                # Outcome Date GA Lbr Len/2nd Weight Sex Delivery Anes PTL Lv  2 Current           1 Term            BDZ:HGDJMEQA/STMHDQQIWLNL except as detailed in HPI.Marland Kitchen  No family history of birth defects. Soc Hx: Never smoker and Recreational drug use: none  Objective:  There were no vitals filed for this visit. Constitutional: Well nourished, well developed female in no acute distress.  HEENT: normal Skin: Warm and Bailey.  Cardiovascular:Regular rate and rhythm.   Extremity: trace to 1+ bilateral pedal edema Respiratory: Clear to auscultation bilateral. Normal respiratory effort Abdomen: gravid, ND, FHT present, mild tenderness on exam Back: no CVAT Neuro: DTRs 2+, Cranial nerves grossly intact Psych: Alert and Oriented x3. No memory deficits. Normal mood and affect.  MS: normal gait, normal bilateral lower extremity ROM/strength/stability.  Pelvic exam: is not limited by body habitus EGBUS: within normal limits Vagina: within normal limits and with normal mucosa Cervix: CERVIX: 0 cm dilated, 10 effaced, -3 station MEMBRANES: intact, NEG POOLING and FERNING Uterus: No contractions observed for 30 minutes.  Adnexa: not evaluated  EFM:FHR: 150 bpm, variability: moderate,  accelerations:  Present,  decelerations:  Absent Toco: None  Assessment & Plan:   33 y.o. G2P1001 @ [redacted]w[redacted]d, Admitted on 12/11/2018:pain associated with Madison Bailey or early labor; no signs of PROM or active labor; Breech; Decreased fetal movement but reactive NST here    Procedures: A NST procedure was  performed with FHR monitoring and a normal baseline established, appropriate time of 20-40 minutes of evaluation, and accels >2 seen w 15x15 characteristics.  Results show a REACTIVE NST.    Discharge Condition: good  Disposition: Discharge disposition: 01-Home or Self Care       Diet: Regular diet  Discharge Activity: Activity as tolerated  Discharge Instructions    Call MD for:   Complete by:  As directed     Worsening contractions or pain; leakage of fluid; bleeding.   Diet general   Complete by:  As directed    Increase activity slowly   Complete by:  As directed      Allergies as of 12/11/2018   No Known Allergies     Medication List    TAKE these medications   prenatal multivitamin Tabs tablet Take 1 tablet by mouth daily at 12 noon.        Total time spent taking care of this patient: 15 minutes  Signed: Letitia Libraobert Paul Jeannelle Bailey 12/11/2018, 11:25 PM

## 2018-12-11 NOTE — Discharge Summary (Signed)
  See FPN 

## 2018-12-12 DIAGNOSIS — O471 False labor at or after 37 completed weeks of gestation: Secondary | ICD-10-CM | POA: Diagnosis not present

## 2018-12-13 MED ORDER — CEFAZOLIN SODIUM-DEXTROSE 2-4 GM/100ML-% IV SOLN
2.0000 g | INTRAVENOUS | Status: AC
  Administered 2018-12-14: 14:00:00 2 g via INTRAVENOUS
  Filled 2018-12-13 (×2): qty 100

## 2018-12-14 ENCOUNTER — Encounter
Admission: RE | Disposition: A | Discharge status: Home / Self Care | Payer: Self-pay | Source: Home / Self Care | Attending: Obstetrics and Gynecology

## 2018-12-14 ENCOUNTER — Inpatient Hospital Stay
Admission: RE | Admit: 2018-12-14 | Discharge: 2018-12-16 | DRG: 787 | Disposition: A | Discharge status: Home / Self Care | Payer: Medicaid Other | Attending: Obstetrics and Gynecology | Admitting: Obstetrics and Gynecology

## 2018-12-14 ENCOUNTER — Inpatient Hospital Stay: Payer: Medicaid Other | Admitting: Anesthesiology

## 2018-12-14 ENCOUNTER — Other Ambulatory Visit: Payer: Self-pay

## 2018-12-14 DIAGNOSIS — Z3A39 39 weeks gestation of pregnancy: Secondary | ICD-10-CM

## 2018-12-14 DIAGNOSIS — O99344 Other mental disorders complicating childbirth: Secondary | ICD-10-CM | POA: Diagnosis present

## 2018-12-14 DIAGNOSIS — F1290 Cannabis use, unspecified, uncomplicated: Secondary | ICD-10-CM | POA: Diagnosis present

## 2018-12-14 DIAGNOSIS — O9081 Anemia of the puerperium: Secondary | ICD-10-CM | POA: Diagnosis present

## 2018-12-14 DIAGNOSIS — D62 Acute posthemorrhagic anemia: Secondary | ICD-10-CM | POA: Diagnosis present

## 2018-12-14 DIAGNOSIS — F419 Anxiety disorder, unspecified: Secondary | ICD-10-CM | POA: Diagnosis present

## 2018-12-14 DIAGNOSIS — O99324 Drug use complicating childbirth: Secondary | ICD-10-CM | POA: Diagnosis present

## 2018-12-14 DIAGNOSIS — O321XX0 Maternal care for breech presentation, not applicable or unspecified: Secondary | ICD-10-CM | POA: Diagnosis present

## 2018-12-14 DIAGNOSIS — F411 Generalized anxiety disorder: Secondary | ICD-10-CM | POA: Diagnosis present

## 2018-12-14 HISTORY — DX: Personal history of other diseases of the digestive system: Z87.19

## 2018-12-14 LAB — CBC
HCT: 35.2 % — ABNORMAL LOW (ref 36.0–46.0)
Hemoglobin: 12.2 g/dL (ref 12.0–15.0)
MCH: 32.2 pg (ref 26.0–34.0)
MCHC: 34.7 g/dL (ref 30.0–36.0)
MCV: 92.9 fL (ref 80.0–100.0)
Platelets: 246 10*3/uL (ref 150–400)
RBC: 3.79 MIL/uL — ABNORMAL LOW (ref 3.87–5.11)
RDW: 14.6 % (ref 11.5–15.5)
WBC: 13 10*3/uL — ABNORMAL HIGH (ref 4.0–10.5)
nRBC: 0 % (ref 0.0–0.2)

## 2018-12-14 SURGERY — Surgical Case
Anesthesia: Spinal

## 2018-12-14 MED ORDER — SENNOSIDES-DOCUSATE SODIUM 8.6-50 MG PO TABS
2.0000 | ORAL_TABLET | ORAL | Status: DC
  Administered 2018-12-15 – 2018-12-16 (×2): 2 via ORAL
  Filled 2018-12-14 (×2): qty 2

## 2018-12-14 MED ORDER — BUPIVACAINE 0.25 % ON-Q PUMP DUAL CATH 400 ML
400.0000 mL | INJECTION | Status: DC
  Filled 2018-12-14: qty 400

## 2018-12-14 MED ORDER — ONDANSETRON HCL 4 MG/2ML IJ SOLN: 4.0000 mg | Freq: Three times a day (TID) | INTRAMUSCULAR | Status: DC | PRN

## 2018-12-14 MED ORDER — OXYTOCIN 40 UNITS IN NORMAL SALINE INFUSION - SIMPLE MED
INTRAVENOUS | Status: AC
  Filled 2018-12-14: qty 1000

## 2018-12-14 MED ORDER — FENTANYL CITRATE (PF) 100 MCG/2ML IJ SOLN
INTRAMUSCULAR | Status: DC | PRN
  Administered 2018-12-14: 15 ug via INTRATHECAL

## 2018-12-14 MED ORDER — SOD CITRATE-CITRIC ACID 500-334 MG/5ML PO SOLN
ORAL | Status: AC
  Administered 2018-12-14: 13:00:00 30 mL
  Filled 2018-12-14: qty 15

## 2018-12-14 MED ORDER — BUPIVACAINE ON-Q PAIN PUMP (FOR ORDER SET NO CHG): INJECTION | Status: DC

## 2018-12-14 MED ORDER — DIPHENHYDRAMINE HCL 25 MG PO CAPS: 25.0000 mg | ORAL_CAPSULE | ORAL | Status: DC | PRN

## 2018-12-14 MED ORDER — EPHEDRINE SULFATE 50 MG/ML IJ SOLN
INTRAMUSCULAR | Status: AC
  Filled 2018-12-14: qty 1

## 2018-12-14 MED ORDER — PRENATAL MULTIVITAMIN CH
1.0000 | ORAL_TABLET | Freq: Every day | ORAL | Status: DC
  Administered 2018-12-15 – 2018-12-16 (×2): 1 via ORAL
  Filled 2018-12-14 (×2): qty 1

## 2018-12-14 MED ORDER — NALBUPHINE HCL 10 MG/ML IJ SOLN: 5.0000 mg | Freq: Once | INTRAMUSCULAR | Status: DC | PRN

## 2018-12-14 MED ORDER — MENTHOL 3 MG MT LOZG
1.0000 | LOZENGE | OROMUCOSAL | Status: DC | PRN
  Filled 2018-12-14: qty 9

## 2018-12-14 MED ORDER — NALOXONE HCL 0.4 MG/ML IJ SOLN: 0.4000 mg | INTRAMUSCULAR | Status: DC | PRN

## 2018-12-14 MED ORDER — MORPHINE SULFATE (PF) 0.5 MG/ML IJ SOLN
INTRAMUSCULAR | Status: AC
  Filled 2018-12-14: qty 10

## 2018-12-14 MED ORDER — BUPIVACAINE HCL (PF) 0.5 % IJ SOLN
INTRAMUSCULAR | Status: AC
  Filled 2018-12-14: qty 30

## 2018-12-14 MED ORDER — KETOROLAC TROMETHAMINE 30 MG/ML IJ SOLN: 30.0000 mg | Freq: Once | INTRAMUSCULAR | Status: DC

## 2018-12-14 MED ORDER — ZOLPIDEM TARTRATE 5 MG PO TABS: 5.0000 mg | ORAL_TABLET | Freq: Every evening | ORAL | Status: DC | PRN

## 2018-12-14 MED ORDER — NALOXONE HCL 4 MG/10ML IJ SOLN
1.0000 ug/kg/h | INTRAVENOUS | Status: DC | PRN
  Filled 2018-12-14: qty 5

## 2018-12-14 MED ORDER — OXYTOCIN 40 UNITS IN NORMAL SALINE INFUSION - SIMPLE MED
INTRAVENOUS | Status: DC | PRN
  Administered 2018-12-14: 1000 mL via INTRAVENOUS

## 2018-12-14 MED ORDER — COCONUT OIL OIL
1.0000 "application " | TOPICAL_OIL | Status: DC | PRN
  Administered 2018-12-16: 1 via TOPICAL
  Filled 2018-12-14: qty 120

## 2018-12-14 MED ORDER — FENTANYL CITRATE (PF) 100 MCG/2ML IJ SOLN
INTRAMUSCULAR | Status: AC
  Filled 2018-12-14: qty 2

## 2018-12-14 MED ORDER — OXYCODONE HCL 5 MG PO TABS
5.0000 mg | ORAL_TABLET | ORAL | Status: DC | PRN
  Administered 2018-12-15: 10 mg via ORAL
  Filled 2018-12-14: qty 2

## 2018-12-14 MED ORDER — SIMETHICONE 80 MG PO CHEW
80.0000 mg | CHEWABLE_TABLET | Freq: Three times a day (TID) | ORAL | Status: DC
  Administered 2018-12-14 – 2018-12-16 (×7): 80 mg via ORAL
  Filled 2018-12-14 (×6): qty 1

## 2018-12-14 MED ORDER — NALBUPHINE HCL 10 MG/ML IJ SOLN: 5.0000 mg | INTRAMUSCULAR | Status: DC | PRN

## 2018-12-14 MED ORDER — BUPIVACAINE IN DEXTROSE 0.75-8.25 % IT SOLN
INTRATHECAL | Status: DC | PRN
  Administered 2018-12-14: 1.6 mL via INTRATHECAL

## 2018-12-14 MED ORDER — LACTATED RINGERS IV SOLN
INTRAVENOUS | Status: DC
  Administered 2018-12-14: 09:00:00 via INTRAVENOUS

## 2018-12-14 MED ORDER — DEXAMETHASONE SODIUM PHOSPHATE 10 MG/ML IJ SOLN
INTRAMUSCULAR | Status: DC | PRN
  Administered 2018-12-14: 5 mg via INTRAVENOUS

## 2018-12-14 MED ORDER — BUPIVACAINE HCL (PF) 0.5 % IJ SOLN
INTRAMUSCULAR | Status: DC | PRN
  Administered 2018-12-14: 10 mL

## 2018-12-14 MED ORDER — SODIUM CHLORIDE 0.9% FLUSH: 3.0000 mL | INTRAVENOUS | Status: DC | PRN

## 2018-12-14 MED ORDER — LACTATED RINGERS IV SOLN
INTRAVENOUS | Status: DC | PRN
  Administered 2018-12-14: 13:00:00 via INTRAVENOUS

## 2018-12-14 MED ORDER — ACETAMINOPHEN 500 MG PO TABS: 1000.0000 mg | ORAL_TABLET | Freq: Four times a day (QID) | ORAL | Status: DC

## 2018-12-14 MED ORDER — WITCH HAZEL-GLYCERIN EX PADS: 1.0000 "application " | MEDICATED_PAD | CUTANEOUS | Status: DC | PRN

## 2018-12-14 MED ORDER — FERROUS SULFATE 325 (65 FE) MG PO TABS
325.0000 mg | ORAL_TABLET | Freq: Two times a day (BID) | ORAL | Status: DC
  Administered 2018-12-14 – 2018-12-16 (×5): 325 mg via ORAL
  Filled 2018-12-14 (×5): qty 1

## 2018-12-14 MED ORDER — DIPHENHYDRAMINE HCL 25 MG PO CAPS: 25.0000 mg | ORAL_CAPSULE | Freq: Four times a day (QID) | ORAL | Status: DC | PRN

## 2018-12-14 MED ORDER — HYDROMORPHONE HCL 1 MG/ML IJ SOLN
0.2000 mg | INTRAMUSCULAR | Status: DC | PRN
  Administered 2018-12-15: 0.4 mg via INTRAVENOUS
  Filled 2018-12-14: qty 1

## 2018-12-14 MED ORDER — SODIUM CHLORIDE 0.9 % IV SOLN
INTRAVENOUS | Status: DC | PRN
  Administered 2018-12-14: 14:00:00 50 ug/min via INTRAVENOUS

## 2018-12-14 MED ORDER — ONDANSETRON HCL 4 MG/2ML IJ SOLN
INTRAMUSCULAR | Status: DC | PRN
  Administered 2018-12-14: 4 mg via INTRAVENOUS

## 2018-12-14 MED ORDER — SCOPOLAMINE 1 MG/3DAYS TD PT72: 1.0000 | MEDICATED_PATCH | Freq: Once | TRANSDERMAL | Status: DC

## 2018-12-14 MED ORDER — SIMETHICONE 80 MG PO CHEW
80.0000 mg | CHEWABLE_TABLET | ORAL | Status: DC
  Administered 2018-12-16: 80 mg via ORAL
  Filled 2018-12-14 (×2): qty 1

## 2018-12-14 MED ORDER — EPHEDRINE SULFATE-NACL 50-0.9 MG/10ML-% IV SOSY
PREFILLED_SYRINGE | INTRAVENOUS | Status: DC | PRN
  Administered 2018-12-14: 15 mg via INTRAVENOUS

## 2018-12-14 MED ORDER — SIMETHICONE 80 MG PO CHEW
80.0000 mg | CHEWABLE_TABLET | ORAL | Status: DC | PRN
  Administered 2018-12-15: 03:00:00 80 mg via ORAL
  Filled 2018-12-14: qty 1

## 2018-12-14 MED ORDER — MORPHINE SULFATE (PF) 0.5 MG/ML IJ SOLN
INTRAMUSCULAR | Status: DC | PRN
  Administered 2018-12-14: .1 mg via INTRATHECAL

## 2018-12-14 MED ORDER — LACTATED RINGERS IV SOLN
INTRAVENOUS | Status: DC
  Administered 2018-12-15: 06:00:00 via INTRAVENOUS

## 2018-12-14 MED ORDER — PHENYLEPHRINE HCL (PRESSORS) 10 MG/ML IV SOLN
INTRAVENOUS | Status: DC | PRN
  Administered 2018-12-14: 300 ug via INTRAVENOUS
  Administered 2018-12-14: 200 ug via INTRAVENOUS

## 2018-12-14 MED ORDER — DIBUCAINE (PERIANAL) 1 % EX OINT: 1.0000 "application " | TOPICAL_OINTMENT | CUTANEOUS | Status: DC | PRN

## 2018-12-14 MED ORDER — ACETAMINOPHEN 500 MG PO TABS
1000.0000 mg | ORAL_TABLET | Freq: Four times a day (QID) | ORAL | Status: AC
  Administered 2018-12-14 – 2018-12-15 (×4): 1000 mg via ORAL
  Filled 2018-12-14 (×4): qty 2

## 2018-12-14 MED ORDER — FLEET ENEMA 7-19 GM/118ML RE ENEM: 1.0000 | ENEMA | Freq: Every day | RECTAL | Status: DC | PRN

## 2018-12-14 MED ORDER — KETOROLAC TROMETHAMINE 30 MG/ML IJ SOLN
30.0000 mg | Freq: Four times a day (QID) | INTRAMUSCULAR | Status: AC
  Administered 2018-12-14 – 2018-12-15 (×4): 30 mg via INTRAVENOUS
  Filled 2018-12-14 (×4): qty 1

## 2018-12-14 MED ORDER — BISACODYL 10 MG RE SUPP: 10.0000 mg | Freq: Every day | RECTAL | Status: DC | PRN

## 2018-12-14 MED ORDER — OXYTOCIN 40 UNITS IN NORMAL SALINE INFUSION - SIMPLE MED
2.5000 [IU]/h | INTRAVENOUS | Status: AC
  Administered 2018-12-14: 15:00:00 2.5 [IU]/h via INTRAVENOUS

## 2018-12-14 MED ORDER — DIPHENHYDRAMINE HCL 50 MG/ML IJ SOLN: 12.5000 mg | INTRAMUSCULAR | Status: DC | PRN

## 2018-12-14 SURGICAL SUPPLY — 24 items
CANISTER SUCT 3000ML PPV (MISCELLANEOUS) ×3 IMPLANT
CHLORAPREP W/TINT 26 (MISCELLANEOUS) ×6 IMPLANT
DERMABOND ADVANCED (GAUZE/BANDAGES/DRESSINGS) ×2
DERMABOND ADVANCED .7 DNX12 (GAUZE/BANDAGES/DRESSINGS) ×1 IMPLANT
DRSG OPSITE POSTOP 4X10 (GAUZE/BANDAGES/DRESSINGS) ×3 IMPLANT
ELECT CAUTERY BLADE 6.4 (BLADE) ×3 IMPLANT
ELECT REM PT RETURN 9FT ADLT (ELECTROSURGICAL) ×3
ELECTRODE REM PT RTRN 9FT ADLT (ELECTROSURGICAL) ×1 IMPLANT
GLOVE BIOGEL PI IND STRL 6.5 (GLOVE) ×2 IMPLANT
GLOVE BIOGEL PI INDICATOR 6.5 (GLOVE) ×4
GOWN STRL REUS W/ TWL LRG LVL3 (GOWN DISPOSABLE) ×1 IMPLANT
GOWN STRL REUS W/ TWL XL LVL3 (GOWN DISPOSABLE) ×2 IMPLANT
GOWN STRL REUS W/TWL LRG LVL3 (GOWN DISPOSABLE) ×2
GOWN STRL REUS W/TWL XL LVL3 (GOWN DISPOSABLE) ×4
NS IRRIG 1000ML POUR BTL (IV SOLUTION) ×3 IMPLANT
PACK C SECTION AR (MISCELLANEOUS) ×3 IMPLANT
PAD OB MATERNITY 4.3X12.25 (PERSONAL CARE ITEMS) ×3 IMPLANT
PAD PREP 24X41 OB/GYN DISP (PERSONAL CARE ITEMS) ×3 IMPLANT
PENCIL SMOKE ULTRAEVAC 22 CON (MISCELLANEOUS) ×3 IMPLANT
SUT MNCRL AB 4-0 PS2 18 (SUTURE) ×3 IMPLANT
SUT PLAIN 3-0 (SUTURE) ×3 IMPLANT
SUT VIC AB 0 CT1 36 (SUTURE) ×9 IMPLANT
SUT VIC AB 2-0 CT1 36 (SUTURE) ×3 IMPLANT
SYR 30ML LL (SYRINGE) ×6 IMPLANT

## 2018-12-14 NOTE — Discharge Summary (Addendum)
OB Discharge Summary     Patient Name: KALENE CUTLER DOB: 06/29/1986 MRN: [id number]  Date of admission: 12/14/2018 Delivering MD: Adrian Prows, MD Date of Delivery: 12/14/2018  Date of discharge: 12/16/2018  Admitting diagnosis: BREECH Intrauterine pregnancy: [redacted]w[redacted]d     Secondary diagnosis: Late prenatal care, marijuana use in pregnancy, low amniotic fluid     Discharge diagnosis: Term Pregnancy Delivered, No other diagnosis                         Hospital course:  Sceduled C/S   33 y.o. yo G2P1001 at [redacted]w[redacted]d was admitted to the hospital 12/14/2018 for scheduled cesarean section with the following indication:Malpresentation and Breech.  Membrane Rupture Time/Date: 1:58 PM ,12/14/2018   Patient delivered a Viable infant.12/14/2018  Details of operation can be found in separate operative note.  Pateint had a postpartum course complicated by anxiety and concerns about behavior with consults to psychiatry and social work. She is ambulating, tolerating a regular diet, passing flatus, and urinating well. Patient is discharged home in stable condition on  12/16/18                                                                        Post partum procedures: Rhogam  Complications: None  Physical exam on 12/16/2018: Vitals:   12/15/18 1600 12/15/18 2318 12/15/18 2325 12/16/18 0945  BP: 124/80  121/87 (!) 133/93  Pulse: 70 70 78 97  Resp: 18 16  20   Temp: 97.7 F (36.5 C) 98.4 F (36.9 C)  98.4 F (36.9 C)  TempSrc: Oral   Oral  SpO2: 98% 100%  98%  Weight:      Height:       General: cooperative and no distress Lochia: appropriate Uterine Fundus: firm Incision: Dressing is clean, dry, and intact DVT Evaluation: No evidence of DVT seen on physical exam.  Labs: Lab Results  Component Value Date   WBC 15.6 (H) 12/15/2018   HGB 9.5 (L) 12/15/2018   HCT 26.8 (L) 12/15/2018   MCV 91.2 12/15/2018   PLT 219 12/15/2018   No flowsheet data found.  Discharge instruction: per  After Visit Summary.  Medications:  Allergies as of 12/16/2018   No Known Allergies     Medication List    TAKE these medications   oxyCODONE-acetaminophen 5-325 MG tablet Commonly known as: Percocet Take 1 tablet by mouth every 4 (four) hours as needed for severe pain.   prenatal multivitamin Tabs tablet Take 1 tablet by mouth daily at 12 noon.            Discharge Care Instructions  (From admission, onward)         Start     Ordered   12/16/18 0000  Discharge wound care:    Comments: You may apply a light dressing for minor discharge from the incision or to keep waistbands of clothing from rubbing.  You may also have been discharge with a clear dressing in which case this will be removed at your postoperative clinic visit.  You may shower, use soap on your incision.  Avoid baths or soaking the incision in the first 6 weeks following your surgery.Marland Kitchen   12/16/18 1200  Diet: routine diet  Activity: Advance as tolerated. Pelvic rest for 6 weeks.   Outpatient follow up: Follow-up Information    Schuman, Jaquelyn Bitterhristanna R, MD In 1 week.   Specialty: Obstetrics and Gynecology Why: For incision check Contact information: [address] [place] [address] [phone]             Postpartum contraception: Undecided Rhogam Given postpartum: yes Rubella vaccine given postpartum: no Varicella vaccine given postpartum: no TDaP given antepartum or postpartum: Yes  Newborn Data: Live born female  Birth Weight: 7 lb 6.5 oz (3360 g) APGAR: 8, 9  Newborn Delivery   Birth date/time:  12/14/2018 14:00:00 Delivery type:  C-Section, Low Transverse Trial of labor:  No C-section categorization:  Primary      Baby Feeding: Breast  Disposition: home with mother  SIGNED:  Oswaldo ConroyJacelyn Y Janea Schwenn, CNM 12/16/2018 12:02 PM

## 2018-12-14 NOTE — Anesthesia Procedure Notes (Signed)
Spinal  Patient location during procedure: OR Start time: 12/14/2018 1:25 PM End time: 12/14/2018 1:35 PM Staffing Anesthesiologist: Fitzgerald, Kathryn L, MD Resident/CRNA: Weatherly, Janice, CRNA Performed: anesthesiologist and resident/CRNA  Preanesthetic Checklist Completed: patient identified, site marked, surgical consent, pre-op evaluation, timeout performed, IV checked, risks and benefits discussed and monitors and equipment checked Spinal Block Patient position: sitting Prep: ChloraPrep Patient monitoring: heart rate, continuous pulse ox, blood pressure and cardiac monitor Approach: midline Location: L3-4 Injection technique: single-shot Needle Needle type: Whitacre and Introducer  Needle gauge: 24 G Needle length: 9 cm Additional Notes Positive free-flowing CSF. Expiration date of kit checked and confirmed. Patient tolerated procedure well, without complications.       

## 2018-12-14 NOTE — Transfer of Care (Signed)
Immediate Anesthesia Transfer of Care Note  Patient: Madison Bailey  Procedure(s) Performed: CESAREAN SECTION (N/A )  Patient Location: Mother/Baby  Anesthesia Type:Spinal  Level of Consciousness: awake, alert  and oriented  Airway & Oxygen Therapy: Patient Spontanous Breathing  Post-op Assessment: Report given to RN and Post -op Vital signs reviewed and stable  Post vital signs: Reviewed and stable  Last Vitals:  Vitals Value Taken Time  BP 110/67 12/14/2018  2:59 PM  Temp    Pulse 86 12/14/2018  2:59 PM  Resp 16 12/14/2018  2:59 PM  SpO2 93 % 12/14/2018  2:59 PM  Vitals shown include unvalidated device data.  Last Pain:  Vitals:   12/14/18 0852  TempSrc: Oral  PainSc: 7       Patients Stated Pain Goal: 1 (01/74/94 4967)  Complications: No apparent anesthesia complications

## 2018-12-14 NOTE — Progress Notes (Signed)
Pt G2P1 [redacted]w[redacted]d presents to L&D for scheduled c/s. Pt had a SARS/Covid-19 nasal swab test performed 12/09/2018 it has resulted negative. Test is only valid for 48-72 hours before scheduled procedure. Provider has ordered a new SARS/Covid-19 swab but pt refused. Pt was educated on the importance of a new SARS/Covid-19 test performed but pt continued to refuse, stated that she has previous facial trauma and surgery and bled after the last SARS/Covid-19 test and has quarantined for 2 months at home. C.Shuman, MD notified of pt refusal.

## 2018-12-14 NOTE — H&P (Signed)
History & Physical  Madison Bailey is an 33 y.o. female.  HPI: Patient presents today for a cesarean section secondary to fetal breech presentation. She recently last week failed an external cephalic version.  Her pregnancy has been complicated by late prenatal care and low amniotic fluid, although she has had sufficient vertical pockets.Pregancy also complicated by maternal marijuana usage. She is feeling well today without complaints.   Clinic [facility] Prenatal Labs  Dating  36 wk [place] Blood type: --/--/A NEG (06/09 0902)   Genetic Screen  late to care, not performed Antibody:POS (06/09 0902)  Anatomic [place] complete Rubella: Immune (05/08 0000) Varicella: Immune  GTT  28 wk:  84   RPR: Nonreactive (05/08 0000)   Rhogam  patient reports she was given this inpatient at another facility at [redacted] wks gestation HBsAg: Negative (05/08 0000)   TDaP vaccine    11/11/2018                   HIV: Non-reactive (05/07 0000)   Flu Shot     Declined                         [id number] (05/30 0000)  Contraception  Pap: ASCUS HR HPV+, colposcopy needed postpartum  CBB     CS/VBAC  Hx vaginal deliver   Baby Food  Breast   Support Person         Past Medical History:  Diagnosis Date  . Hernia, umbilical 9811  . History of hiatal hernia     Past Surgical History:  Procedure Laterality Date  . HERNIA REPAIR      History reviewed. No pertinent family history.  Social History:  reports that she has never smoked. She has never used smokeless tobacco. She reports previous alcohol use. She reports current drug use. Drug: Marijuana.  Allergies: No Known Allergies  Medications: I have reviewed the patient's current medications.  Results for orders placed or performed during the hospital encounter of 12/14/18 (from the past 48 hour(s))  CBC     Status: Abnormal   Collection Time: 12/14/18  9:02 AM  Result Value Ref Range   WBC 13.0 (H) 4.0 - 10.5 K/uL   RBC 3.79 (L) 3.87 - 5.11 MIL/uL   Hemoglobin 12.2  12.0 - 15.0 g/dL   HCT 35.2 (L) 36.0 - 46.0 %   MCV 92.9 80.0 - 100.0 fL   MCH 32.2 26.0 - 34.0 pg   MCHC 34.7 30.0 - 36.0 g/dL   RDW 14.6 11.5 - 15.5 %   Platelets 246 150 - 400 K/uL   nRBC 0.0 0.0 - 0.2 %    Comment: Performed at [facility], [address] [place], [place] [address]  Type and screen [place]     Status: None   Collection Time: 12/14/18  9:02 AM  Result Value Ref Range   ABO/RH(D) A NEG    Antibody Screen POS    Sample Expiration 12/17/2018,2359    Antibody Identification      PASSIVELY ACQUIRED ANTI-D Performed at [facility], [address] [place], [place] [address]     No results found.  Review of Systems  Constitutional: Negative for chills, fever, malaise/fatigue and weight loss.  HENT: Negative for congestion, hearing loss and sinus pain.   Eyes: Negative for blurred vision and double vision.  Respiratory: Negative for cough, sputum production, shortness of breath and wheezing.   Cardiovascular: Negative  for chest pain, palpitations, orthopnea and leg swelling.  Gastrointestinal: Negative for abdominal pain, constipation, diarrhea, nausea and vomiting.  Genitourinary: Negative for dysuria, flank pain, frequency, hematuria and urgency.  Musculoskeletal: Negative for back pain, falls and joint pain.  Skin: Negative for itching and rash.  Neurological: Negative for dizziness and headaches.  Psychiatric/Behavioral: Negative for depression, substance abuse and suicidal ideas. The patient is not nervous/anxious.    Blood pressure 115/75, pulse 90, temperature 97.6 F (36.4 C), temperature source Oral, resp. rate 15, height 5\' 6"  (1.676 m), weight 81.6 kg, last menstrual period 04/13/2018. Physical Exam  Nursing note and vitals reviewed. Constitutional: She is oriented to person, place, and time. She appears well-developed and well-nourished.  HENT:  Head: Normocephalic and atraumatic.  Cardiovascular:  Normal rate and regular rhythm.  Respiratory: Effort normal and breath sounds normal.  GI: Soft. Bowel sounds are normal.  Musculoskeletal: Normal range of motion.  Neurological: She is alert and oriented to person, place, and time.  Skin: Skin is warm and dry.  Psychiatric: She has a normal mood and affect. Her behavior is normal. Judgment and thought content normal.   NST: 120 bpm baseline, moderate variability, 15x15 accelerations, no decelerations.   Assessment/Plan: 33 yo G2P1001 3159w0d 1. Breech presentation- failed ECV last week. Will proceed with primary low transverse cesarean sections. The risks benefits and alternatives have been discussed in detail with the patient. She agrees to proceed.   [name] 12/14/2018, 11:29 AM

## 2018-12-14 NOTE — Op Note (Signed)
Cesarean Section Procedure Note 12/14/18  Pre-operative Diagnosis:  1. Breech Presentation  2. [redacted] week gestation Post-operative Diagnosis: same, delivered. Procedure: Primary Low Transverse Cesarean Section   Surgeon: Adrian Prows MD   Assistant(s): Malachy Mood - No other skilled surgical assistant available. Anesthesia: Spinal Estimated Blood Loss: 1000 cc, quantitative blood loss pending Complications: None; patient tolerated the procedure well.   Disposition: PACU - hemodynamically stable. Condition: stable   Findings: A female infant in the cephalic presentation. Amniotic fluid - clear   Birth weight: 7 lbs 7oz Apgars of 8 and 9.  Intact placenta with a three-vessel cord. Grossly normal uterus, tubes and ovaries bilaterally. No intraabdominal adhesions were noted.   Procedure Details    The patient was taken to operating room, identified as the correct patient and the procedure verified as C-Section Delivery. A time out was held and the above information confirmed. After induction of anesthesia, the patient was draped and prepped in the usual sterile manner. A Pfannenstiel incision was made and carried down through the subcutaneous tissue to the fascia. Fascial incision was made and extended transversely with the Mayo scissors. The fascia was separated from the underlying rectus tissue superiorly and inferiorly. The peritoneum was identified and entered bluntly. Peritoneal incision was extended longitudinally. A low transverse hysterotomy was made. The fetus was delivered atraumatically. The umbilical cord was clamped x2 and cut and the infant was handed to the awaiting pediatricians. The placenta was removed intact and appeared normal with a 3-vessel cord. The uterus was exteriorized and cleared of all clot and debris. The hysterotomy was closed with running sutures of 0 Vicryl suture. A second imbricating layer was placed with the same suture. Excellent hemostasis was  observed.   The uterus was returned to the abdomen. The pelvis was irrigated and again, excellent hemostasis was noted. The peritoneum was closed with a running stitch of 2-0 Vicryl. The On Q Pain pump System was then placed.  Trocars were placed through the abdominal wall into the subfascial space and these were used to thread the silver soaker cathaters into place.The rectus muscles were inspected and were hemostatic. The rectus fascia was then reapproximated with running sutures of 0-vicryl, with careful placement not to incorporate the cathaters. Subcutaneous tissues are then irrigated with saline and hemostasis assured with the bovie. The subcutaneous fat was approximated with 3-0 plain and a running stitch.  The skin was closed with 4-0 monocryl suture in a subcuticular fashion followed by skin adhesive. The cathaters are flushed each with 5 mL of Bupivicaine and stabilized into place with dressing. Instrument, sponge, and needle counts were correct prior to the abdominal closure and at the conclusion of the case.  The patient tolerated the procedure well and was transferred to the recovery room in stable condition.   Homero Fellers MD [facility] 12/14/18 2:48 PM

## 2018-12-14 NOTE — Anesthesia Preprocedure Evaluation (Addendum)
Anesthesia Evaluation  Patient identified by MRN, date of birth, ID band Patient awake    Reviewed: Allergy & Precautions, H&P , NPO status , Patient's Chart, lab work & pertinent test results  Airway Mallampati: I  TM Distance: >3 FB Neck ROM: full    Dental  (+) Teeth Intact   Pulmonary neg pulmonary ROS,           Cardiovascular Exercise Tolerance: Good (-) hypertensionnegative cardio ROS       Neuro/Psych    GI/Hepatic hiatal hernia,   Endo/Other    Renal/GU   negative genitourinary   Musculoskeletal   Abdominal   Peds  Hematology negative hematology ROS (+)   Anesthesia Other Findings C/S for breech presentation  Past Medical History: 6720: Hernia, umbilical No date: History of hiatal hernia  Past Surgical History: No date: HERNIA REPAIR  BMI    Body Mass Index:  29.05 kg/m      Reproductive/Obstetrics (+) Pregnancy                           Anesthesia Physical Anesthesia Plan  ASA: II  Anesthesia Plan: Spinal   Post-op Pain Management:    Induction:   PONV Risk Score and Plan:   Airway Management Planned:   Additional Equipment:   Intra-op Plan:   Post-operative Plan:   Informed Consent: I have reviewed the patients History and Physical, chart, labs and discussed the procedure including the risks, benefits and alternatives for the proposed anesthesia with the patient or authorized representative who has indicated his/her understanding and acceptance.     Dental Advisory Given  Plan Discussed with: Anesthesiologist and CRNA  Anesthesia Plan Comments:        Anesthesia Quick Evaluation

## 2018-12-14 NOTE — Anesthesia Post-op Follow-up Note (Signed)
Anesthesia QCDR form completed.        

## 2018-12-15 ENCOUNTER — Encounter: Payer: Self-pay | Admitting: Certified Nurse Midwife

## 2018-12-15 LAB — CBC
HCT: 26.8 % — ABNORMAL LOW (ref 36.0–46.0)
Hemoglobin: 9.5 g/dL — ABNORMAL LOW (ref 12.0–15.0)
MCH: 32.3 pg (ref 26.0–34.0)
MCHC: 35.4 g/dL (ref 30.0–36.0)
MCV: 91.2 fL (ref 80.0–100.0)
Platelets: 219 10*3/uL (ref 150–400)
RBC: 2.94 MIL/uL — ABNORMAL LOW (ref 3.87–5.11)
RDW: 14.2 % (ref 11.5–15.5)
WBC: 15.6 10*3/uL — ABNORMAL HIGH (ref 4.0–10.5)
nRBC: 0 % (ref 0.0–0.2)

## 2018-12-15 LAB — FETAL SCREEN: Fetal Screen: NEGATIVE

## 2018-12-15 MED ORDER — IBUPROFEN 600 MG PO TABS
600.0000 mg | ORAL_TABLET | Freq: Four times a day (QID) | ORAL | Status: DC | PRN
  Administered 2018-12-15 – 2018-12-16 (×5): 600 mg via ORAL
  Filled 2018-12-15 (×5): qty 1

## 2018-12-15 MED ORDER — ACETAMINOPHEN 500 MG PO TABS
1000.0000 mg | ORAL_TABLET | Freq: Four times a day (QID) | ORAL | Status: DC | PRN
  Administered 2018-12-15 – 2018-12-16 (×3): 1000 mg via ORAL
  Filled 2018-12-15 (×3): qty 2

## 2018-12-15 MED ORDER — RHO D IMMUNE GLOBULIN 1500 UNIT/2ML IJ SOSY
300.0000 ug | PREFILLED_SYRINGE | Freq: Once | INTRAMUSCULAR | Status: AC
  Administered 2018-12-15: 300 ug via INTRAVENOUS
  Filled 2018-12-15: qty 2

## 2018-12-15 NOTE — Progress Notes (Signed)
Overview of day:  Patient has seemed withdrawn today and a bit on the emotional side. When I try to hold conversations with her, answers are short and its hard to establish good communication. I also cared for this patient briefly on 6/9 and admitted her. Communication was still short but nothing unusual. Just like I do with all my new admissions I went over all the house keeping things with the patient such as what to expect with her stay here for her and for her baby including ALL THE 24 hour TESTING WE DO ON THE INFANT. Patient verbalized understanding and had no questions.   This am 6/10 the patients mother (grandmother of infant) came to the nurses station stating that her daugter needed help, she was in pain in her shoulder. RN was looking up meds that patient could have and patient starts screaming out from the room in pain and the patients mother comes flying out of the room screaming my daughter needs help now, can someone do something. RN runs in and the patient is squirming around all in the bed screaming and crying saying her shoulder hurts so bad. RN sat the patient  up, walked her to the restroom (she voided a lot), and the nursing staff called colleen to come assess the patient. Per midwifes order, RN administered .4 of dilaudid, toradol, and simethicone. The patient sat up in the chair and the heating pad was put on the shoulder. RN talked to the patient about gas pain, things that contributed to it and things to help it go away. Patient acknowledged understanding. Pain improved throughout the day.   Around 1600 I went in with another RN to do rounds and give the patient rhogam and witnessed the patient being slightly rough with the infant and showing frustration with breast feeding. Patient stated that the infant wasn't breastfeeding well like earlier and would suck and fall asleep and she didn't have enough of milk, but would cry and act hungry. RN explained to the patient that the infant  was most likely cluster feeding and this was normal. Patient acknowledged understanding but would like to pump and give the infant pumped milk. RN set the patient up with a pump and swaddled the infant to sleep. Patient pumped 7mLs and syringe fed the infant. The infant seemed satisfied. RN told the patient to call if she needed any help with feeding and explained to the patient all her feeding options, that we were here to support her wishes. She said she would like to breast feed and pump and use a syringe now, due to nipple confusion.   Around 1700 the transition RN entered the room to do 24 hour testing on the infant. See carol morris's note.            

## 2018-12-15 NOTE — Progress Notes (Addendum)
POD #1 Subjective:   Complains of pain in right shoulder this AM. OOB to void this AM (1076ml).  Tolerating regular diet.   Objective:  Blood pressure 102/75, pulse 67, temperature 98 F (36.7 C), resp. rate 18, height 5\' 6"  (1.676 m), weight 81.6 kg, last menstrual period 04/13/2018, SpO2 97 %,   Urine output 1350+1000 ml/ Intake 1350 ml General: Lying on right side, cuddling with baby, trying to rest, appears uncomfortable when talking/answering questions Pulmonary: no increased work of breathing/ CTAB Heart: RRR without murmur Abdomen: non-distended, non-tender, bowel sounds active Incision: Honey comb dressing C+D+I, ON Q intact Extremities: no edema, no erythema, no tenderness  Results for orders placed or performed during the hospital encounter of 12/14/18 (from the past 72 hour(s))  CBC     Status: Abnormal   Collection Time: 12/14/18  9:02 AM  Result Value Ref Range   WBC 13.0 (H) 4.0 - 10.5 K/uL   RBC 3.79 (L) 3.87 - 5.11 MIL/uL   Hemoglobin 12.2 12.0 - 15.0 g/dL   HCT 35.2 (L) 36.0 - 46.0 %   MCV 92.9 80.0 - 100.0 fL   MCH 32.2 26.0 - 34.0 pg   MCHC 34.7 30.0 - 36.0 g/dL   RDW 14.6 11.5 - 15.5 %   Platelets 246 150 - 400 K/uL   nRBC 0.0 0.0 - 0.2 %    Comment: Performed at [facility], [address] [place], [place] [address]  Type and screen [place]     Status: None   Collection Time: 12/14/18  9:02 AM  Result Value Ref Range   ABO/RH(D) A NEG    Antibody Screen POS    Sample Expiration 12/17/2018,2359    Antibody Identification PASSIVELY ACQUIRED ANTI-D    Unit Number [id number]    Blood Component Type RED CELLS,LR    Unit division 00    Status of Unit REL FROM [facility]    Transfusion Status OK TO TRANSFUSE    Crossmatch Result      COMPATIBLE Performed at [facility], [address], [place], [place] [address]    Unit Number [id number]    Blood Component Type RED CELLS,LR    Unit division 00    Status  of Unit REL FROM [facility]    Transfusion Status OK TO TRANSFUSE    Crossmatch Result COMPATIBLE   Rhogam injection     Status: None (Preliminary result)   Collection Time: 12/15/18  4:37 AM  Result Value Ref Range   Unit Number [id number]    Blood Component Type RHIG    Unit division 00    Status of Unit ALLOCATED    Transfusion Status OK TO TRANSFUSE   Fetal screen     Status: None   Collection Time: 12/15/18  4:37 AM  Result Value Ref Range   Fetal Screen      NEG Performed at [facility] [address] [place], [place] [address]   CBC     Status: Abnormal   Collection Time: 12/15/18  4:37 AM  Result Value Ref Range   WBC 15.6 (H) 4.0 - 10.5 K/uL   RBC 2.94 (L) 3.87 - 5.11 MIL/uL   Hemoglobin 9.5 (L) 12.0 - 15.0 g/dL   HCT 26.8 (L) 36.0 - 46.0 %   MCV 91.2 80.0 - 100.0 fL   MCH 32.3 26.0 - 34.0 pg   MCHC 35.4 30.0 - 36.0 g/dL   RDW 14.2 11.5 - 15.5 %  Platelets 219 150 - 400 K/uL   nRBC 0.0 0.0 - 0.2 %    Comment: Performed at [facility], [address] [place], [place] [address]   Information for the patient's newborn:  Curly ShoresJohnson, Boy Laylee [[id number]][[id number]]  A POS   Assessment:   33 y.o. [id number] postoperativeday # 1-stable  Right shoulder pain probably due to intra abdominal air stimulating phrenic  Nerve. Recommend ambulating/ pain medication prn   Plan:  1) Acute blood loss anemia - hemodynamically stable and asymptomatic - po ferrous sulfate and vitamins  2) A negative, baby A positive-Rhogam candidate/ RI/ VI  3) TDAP-given 11/11/2018  4) Breast/Contraception-undecided  5) Disposition-possibly discharge on POD #2  Farrel Connersolleen Pearse Shiffler, CNM

## 2018-12-15 NOTE — Anesthesia Post-op Follow-up Note (Signed)
  Anesthesia Pain Follow-up Note  Patient: Madison Bailey  Day #: 1  Date of Follow-up: 12/15/2018 Time: 8:39 AM  Last Vitals:  Vitals:   12/15/18 0700 12/15/18 0810  BP:  102/75  Pulse: 74 67  Resp:  18  Temp:  36.7 C  SpO2: 96% 97%    Level of Consciousness: alert  Pain: mild   Side Effects:None  Catheter Site Exam:clean     Plan: D/C from anesthesia care at surgeon's request  Estill Batten
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
## 2018-12-15 NOTE — Plan of Care (Signed)
Vs stable; up with assistance; tolerating regular diet; taking scheduled tylenol and scheduled toradol for pain control; catheter removed at 0315 and pt needs to void; pt breastfeeding and has not asked for assistance this shift

## 2018-12-15 NOTE — Anesthesia Postprocedure Evaluation (Signed)
Anesthesia Post Note  Patient: Madison Bailey  Procedure(s) Performed: CESAREAN SECTION (N/A )  Patient location during evaluation: Mother Baby Anesthesia Type: Spinal Level of consciousness: oriented and awake and alert Pain management: pain level controlled Vital Signs Assessment: post-procedure vital signs reviewed and stable Respiratory status: spontaneous breathing and respiratory function stable Cardiovascular status: blood pressure returned to baseline and stable Postop Assessment: no headache, no backache, no apparent nausea or vomiting and able to ambulate Anesthetic complications: no     Last Vitals:  Vitals:   12/15/18 0700 12/15/18 0810  BP:  102/75  Pulse: 74 67  Resp:  18  Temp:  36.7 C  SpO2: 96% 97%    Last Pain:  Vitals:   12/15/18 0755  TempSrc:   PainSc: 0-No pain                 Kaytie Ratcliffe,  Clearnce Sorrel

## 2018-12-15 NOTE — Progress Notes (Signed)
Went into patient room to do 24 hour testing on infant, explained tests to mother and grandmother both verbalized understanding. Mother said "this is one of those tests where they take your blood and you never hear anything back, they are just collecting your information". I set up the hearing screen machine and started the test mom started crying. I asked her what was wrong, she shook her head but didn't say anything. She then started saying that I was trying to lobotomize the baby and then she said the machine was calcifying the baby's brain. I discontinued the test, apologized for upsetting her and left. Grandmother came to the nursing station and has requested to leave AMA. Nurse has notified provider who is coming in to see patient.

## 2018-12-15 NOTE — Progress Notes (Signed)
Went into patient room to do 24 hour testing on infant, explained tests to mother and grandmother both verbalized understanding. Mother said "this is one of those tests where they take your blood and you never hear anything back, they are just collecting your information". I set up the hearing screen machine and started the test mom started crying. I asked her what was wrong, she shook her head but didn't say anything. She then started saying that I was trying to lobotomize the baby and then she said the machine was calcifying the baby's brain. I discontinued the test, apologized for upsetting her and left. Grandmother came to the nursing station and has requested to leave AMA. Nurse has notified provider who is coming in to see patient. 

## 2018-12-16 DIAGNOSIS — F411 Generalized anxiety disorder: Secondary | ICD-10-CM | POA: Diagnosis present

## 2018-12-16 LAB — RHOGAM INJECTION: Unit division: 0

## 2018-12-16 MED ORDER — OXYCODONE-ACETAMINOPHEN 5-325 MG PO TABS: 1.0000 | ORAL_TABLET | ORAL | 0 refills | Status: DC | PRN

## 2018-12-16 NOTE — Lactation Note (Signed)
Lactation Consultation Note  Patient Name: Madison Bailey [id number] Date: 12/16/2018 Reason for consult: Follow-up assessment   Maternal Data  Mother is an experienced breast feeder who declined assistance.
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
## 2018-12-16 NOTE — Discharge Instructions (Signed)
Please call your doctor or return to the ER if you experience any chest pains, shortness of breath, dizziness, visual changes, fever greater than 101, any heavy bleeding (saturating more than 1 pad per hour), large clots, or foul smelling discharge, any worsening abdominal pain and cramping that is not controlled by pain medication, any breast concerns (redness/pain) or any signs of postpartum depression. No tampons, enemas, douches, or sexual intercourse for 6 weeks. Also avoid tub baths, hot tubs, or swimming for 6 weeks.   Check your incision daily for any signs of infection such as redness, warmth, swelling, increased pain, or pus/foul smelling drainage   Activity: do not lift over 10 lbs for 6 weeks  No driving for 2 weeks  Pelvic rest for 6 weeks  Showers only for 6 weeks

## 2018-12-16 NOTE — Progress Notes (Signed)
Patient cleared by psychiatry, social work, pediatrician, and OBGYN for discharge with infant. List of mental health resources in [facility] provided to patient. Discharge order received from doctor. Reviewed discharge instructions including On-Q pump removal instructions and prescriptions with patient and answered all questions. Follow up appointment given. Incision cleaning kit given. Patient verbalized understanding. ID bands checked. Patient discharged home with infant via wheelchair by nursing/auxillary.    Hilbert Bible, RN

## 2018-12-16 NOTE — Consult Note (Signed)
Reason for Consult: Anxiety  Referring Physician: Dr. Gilman Schmidt Patient Identification: Madison Bailey MRN:  [id number] Principal Diagnosis: Anxiety Diagnosis:  Principal Problem:   Anxiety Active Problems:   Anxiety  Total Time spent with patient: 1 hour   Subjective: "I am doing fine. They want to put electrodes on my baby neck and I have never seen that before."   33 y.o. G2P1001 at 60w0dwas admitted to the hospital 12/14/2018 for scheduled cesarean section with the following indication:Malpresentation and Breech.  The patient was seen face-to-face by this provider. The patient chart, provider, nursing notes were reviewed and consulted with Dr. KDwyane Dee06/04/2019 due to the care of the patient. It was discussed with the provider that the patient does not meet criteria to be admitted to the inpatient psychiatric unit. During this provider assessment and discussion with the patient.  She states that her life is getting better.  She discussed a lot of her anxiety stem from her mom.  She voiced that she will be living at home with her grandmother, mom, sister and her 112year old son.  She [place].  She discussed that once the baby is older she [place] the baby [place]  She admitted to having PTSD.  She was ask why does she have that diagnosis?  She stated "it comes from being poor."  During her assessment she denies being depressed but admits to dealing with anxiety.  She discussed that she has not seen a psychiatrist, but have met with a therapist. On evaluation the patient  is alert and oriented x4, anxious but cooperative, and mood-congruent with affect. The patient does not appear to be responding to internal or external stimuli. Neither is the patient presenting with any delusional thinking. The patient denies auditory or visual hallucinations. The patient denies any suicidal, homicidal, or  self-harm ideations.  She denies having a history of suicidal or self-harm ideation.  The patient is not presenting with any psychotic or paranoid behaviors. During an encounter with the patient, she was able to answer questions appropriately. Collateral was obtained from the patient's sister DNapoleon [phone]  She voiced her sister has been doing great.  She states the patient is a great mother to her 193year old son and she knows she is going to be a great mother to the baby.  Darian, continues to discuss that "I am not concerned about the baby,  because I know she will be a good mom to him."  She voiced the patient has great support at home and there should not be any alarm or concern for the baby.  Plan: The patient is not a safety risk to self or her children and does not require psychiatric inpatient admission for stabilization and treatment.  The patient was educated on self care.   This provider discussed medication options if or when she needs it during breast feeding or not, therapy and self-care.

## 2018-12-23 ENCOUNTER — Ambulatory Visit: Payer: MEDICAID | Admitting: Obstetrics and Gynecology

## 2018-12-25 LAB — TYPE AND SCREEN
ABO/RH(D): A NEG
Antibody Screen: POSITIVE
Unit division: 0
Unit division: 0

## 2018-12-25 LAB — BPAM RBC
Blood Product Expiration Date: 202007042359
Blood Product Expiration Date: 202007042359
Unit Type and Rh: 600
Unit Type and Rh: 600

## 2019-05-06 ENCOUNTER — Telehealth: Payer: Self-pay

## 2019-05-10 NOTE — Telephone Encounter (Signed)
TC with patient.  Discussed need for colpo and inquired whether she has had PP since delivery.  Patient would like to come in for PP/BCM. Appt scheduled Aileen Fass, RN

## 2019-05-11 ENCOUNTER — Ambulatory Visit: Payer: Self-pay

## 2019-05-13 DIAGNOSIS — Z8742 Personal history of other diseases of the female genital tract: Secondary | ICD-10-CM

## 2019-05-16 ENCOUNTER — Ambulatory Visit: Payer: Self-pay

## 2019-06-09 NOTE — Telephone Encounter (Signed)
DNKA original pap appt.  Patient r/s for 06/13/2019 Aileen Fass, RN

## 2019-06-13 ENCOUNTER — Ambulatory Visit (LOCAL_COMMUNITY_HEALTH_CENTER): Payer: Medicaid Other | Admitting: Family Medicine

## 2019-06-13 ENCOUNTER — Other Ambulatory Visit: Payer: Self-pay

## 2019-06-13 ENCOUNTER — Encounter: Payer: Self-pay | Admitting: Physician Assistant

## 2019-06-13 VITALS — BP 124/84 | Ht 67.0 in | Wt 165.0 lb

## 2019-06-13 DIAGNOSIS — Z98891 History of uterine scar from previous surgery: Secondary | ICD-10-CM

## 2019-06-13 DIAGNOSIS — Z30013 Encounter for initial prescription of injectable contraceptive: Secondary | ICD-10-CM | POA: Diagnosis not present

## 2019-06-13 DIAGNOSIS — Z3009 Encounter for other general counseling and advice on contraception: Secondary | ICD-10-CM | POA: Diagnosis not present

## 2019-06-13 DIAGNOSIS — Z8742 Personal history of other diseases of the female genital tract: Secondary | ICD-10-CM

## 2019-06-13 MED ORDER — MEDROXYPROGESTERONE ACETATE 150 MG/ML IM SUSP
150.0000 mg | INTRAMUSCULAR | Status: AC
  Administered 2019-06-13: 150 mg via INTRAMUSCULAR

## 2019-06-13 NOTE — Progress Notes (Signed)
Pt desires to go to [facility] for colpo; Associate Professor informed.  Provider orders completed.

## 2019-06-13 NOTE — Progress Notes (Signed)
Family Planning Visit- Repeat Yearly Visit  Subjective:  Madison Bailey is a 33 y.o. being seen today for an well woman visit and to discuss family planning options.    She is currently using none for pregnancy prevention. Patient reports she does not if she or her partner wants a pregnancy in the next year. Patient  has GAD (generalized anxiety disorder) and History of abnormal cervical Pap smear on their problem list.  Chief Complaint  Patient presents with  . Contraception    deisre to start depo (in arm)    Patient reports here for depo. Has been on this before without problems. No current/hx br ca, TE, undiagnosed vaginal bleed. LMP started today. Last sex was 4+ months ago.   She gave birth in June, missed PP visit. Now 6 months pp. Has had normal periods since then. C/s scar healing well. Her mood has been good. She is both breast and formula feeding. Not currently working outside the home.   Due for a colpo, has not had this yet, wasn't sure where to go.     Does the patient desire a pregnancy in the next year? (OKQ flowsheet) No  See flowsheet for other program required questions.   Body mass index is 25.84 kg/m. - Patient is eligible for diabetes screening based on BMI and age >68?  not applicable HA1C ordered? not applicable  Patient reports 1 of partners in last year. Desires STI screening?  No - declines  Does the patient have a current or past history of drug use? No   No components found for: HCV]   Health Maintenance Due  Topic Date Due  . TETANUS/TDAP  06/28/2004  . INFLUENZA VACCINE  02/05/2019    ROS  The following portions of the patient's history were reviewed and updated as appropriate: allergies, current medications, past family history, past medical history, past social history, past surgical history and problem list. Problem list updated.  Objective:   Vitals:   06/13/19 1358  BP: 124/84  Weight: 165 lb (74.8 kg)  Height: 5\' 7"  (1.702 m)     Physical Exam  Gen: well appearing, NAD HEENT: no scleral icterus CV: RR Lung: Normal WOB Abd: well healed LTCS scar, nontender Ext: warm well perfused, no edema   Assessment and Plan:  Madison Bailey is a 33 y.o. female presenting to the [facility] Department for an initial well woman exam/family planning visit  Contraception counseling: Reviewed all forms of birth control options in the tiered based approach. available including abstinence; over the counter/barrier methods; hormonal contraceptive medication including pill, patch, ring, injection,contraceptive implant; hormonal and nonhormonal IUDs; permanent sterilization options including vasectomy and the various tubal sterilization modalities. Risks, benefits, and typical effectiveness rates were reviewed.  Questions were answered.  Written information was also given to the patient to review.  Patient desires depo, this was prescribed for patient. She was told to call with any further questions, or with any concerns about this method of contraception.  Emphasized use of condoms 100% of the time for STI prevention.  ECP: n/a   Patient desires depo, this was prescribed for patient. She will follow up in  3 months for repeat inj, 1 yr for surveillance.  Patient was counseled on the side effect of the method chosen, the correct use of her desired method and how to discontinue in the future. She was told to call with any further questions, or with any concerns about this method of contraception.  Emphasized use of condoms 100% of the time for STI prevention.   1. Encounter for initial prescription of injectable contraceptive Rx depo today. Counseling as above. - medroxyPROGESTERone (DEPO-PROVERA) injection 150 mg  2. Status post cesarean section routine postpartum follow-up -She is 6 months pp, here for contraception visit but discussed pp issues as well. C/s site healing well. Mood is stable per pt. Breast + formula  feeding going well per pt.   3. History of abnormal cervical Pap smear -Pt needs colpo d/t hx ASCUS, HRHPV positive from 11/2018. RN to connect pt to [facility] for colpo, see separate note.    Return in about 11 weeks (around 08/29/2019) for depo.  No future appointments.  Madison Keen, PA-C

## 2019-06-14 ENCOUNTER — Telehealth: Payer: Self-pay

## 2019-06-14 NOTE — Telephone Encounter (Signed)
Colpo referral faxed to [facility] with confirmation. Aileen Fass, RN

## 2019-09-27 ENCOUNTER — Emergency Department
Admission: EM | Admit: 2019-09-27 | Discharge: 2019-09-27 | Disposition: A | Discharge status: Home / Self Care | Payer: Medicaid Other | Attending: Emergency Medicine | Admitting: Emergency Medicine

## 2019-09-27 ENCOUNTER — Other Ambulatory Visit: Payer: Self-pay

## 2019-09-27 DIAGNOSIS — Z5321 Procedure and treatment not carried out due to patient leaving prior to being seen by health care provider: Secondary | ICD-10-CM | POA: Diagnosis not present

## 2019-09-27 DIAGNOSIS — M542 Cervicalgia: Secondary | ICD-10-CM | POA: Insufficient documentation

## 2019-09-27 NOTE — ED Triage Notes (Signed)
Pt states that she broke her neck a few years ago, but states didn't get seen or evaluated for it, pt states that she thinks she broke her neck again by putting her head against the wall and pushing her bed to move it and heard it crack today. Pt reports pain in the base of her neck

## 2019-09-27 NOTE — ED Provider Notes (Signed)
Patient was triaged, placed in a room.  Unfortunately patient did not want to wait and left prior to my assessment.  I have not seen, assessed, treated the patient at this time.   Madison Bailey 09/27/19 2257    Miguel Aschoff., MD 09/28/19 [phone]

## 2019-09-27 NOTE — ED Notes (Addendum)
Pt walked out to hallway, states she would like to leave. Christiane Ha- PA made aware. Pt with steady gait, NAd noted

## 2019-09-28 ENCOUNTER — Encounter: Payer: Self-pay | Admitting: Emergency Medicine

## 2019-09-28 ENCOUNTER — Other Ambulatory Visit: Payer: Self-pay

## 2019-09-28 ENCOUNTER — Emergency Department: Payer: Medicaid Other

## 2019-09-28 ENCOUNTER — Emergency Department
Admission: EM | Admit: 2019-09-28 | Discharge: 2019-09-28 | Disposition: A | Discharge status: Home / Self Care | Payer: Medicaid Other | Attending: Emergency Medicine | Admitting: Emergency Medicine

## 2019-09-28 DIAGNOSIS — M62838 Other muscle spasm: Secondary | ICD-10-CM | POA: Diagnosis not present

## 2019-09-28 DIAGNOSIS — Y929 Unspecified place or not applicable: Secondary | ICD-10-CM | POA: Insufficient documentation

## 2019-09-28 DIAGNOSIS — Y9389 Activity, other specified: Secondary | ICD-10-CM | POA: Diagnosis not present

## 2019-09-28 DIAGNOSIS — X500XXA Overexertion from strenuous movement or load, initial encounter: Secondary | ICD-10-CM | POA: Insufficient documentation

## 2019-09-28 DIAGNOSIS — Y999 Unspecified external cause status: Secondary | ICD-10-CM | POA: Insufficient documentation

## 2019-09-28 DIAGNOSIS — M542 Cervicalgia: Secondary | ICD-10-CM | POA: Diagnosis present

## 2019-09-28 MED ORDER — CYCLOBENZAPRINE HCL 10 MG PO TABS
10.0000 mg | ORAL_TABLET | Freq: Once | ORAL | Status: AC
  Administered 2019-09-28: 15:00:00 10 mg via ORAL
  Filled 2019-09-28: qty 1

## 2019-09-28 MED ORDER — CYCLOBENZAPRINE HCL 5 MG PO TABS: 5.0000 mg | ORAL_TABLET | Freq: Three times a day (TID) | ORAL | 0 refills | Status: AC | PRN

## 2019-09-28 MED ORDER — NAPROXEN 500 MG PO TABS: 500.0000 mg | ORAL_TABLET | Freq: Two times a day (BID) | ORAL | 0 refills | Status: AC

## 2019-09-28 MED ORDER — NAPROXEN 500 MG PO TABS
500.0000 mg | ORAL_TABLET | Freq: Once | ORAL | Status: AC
  Administered 2019-09-28: 500 mg via ORAL
  Filled 2019-09-28: qty 1

## 2019-09-28 NOTE — Discharge Instructions (Addendum)
Your are being treated for muscle pain and muscle spasm to the neck and upper back. Take the prescription meds as directed. Apply ice and/or moist heat packs to reduce symptoms. Return to the ED as needed.
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
## 2021-01-17 IMAGING — US LIMITED OBSTETRIC ULTRASOUND
1 series · 14 of 21 positions shown · non-contrast
Comparison: none

CLINICAL DATA: AFI valuation.

EXAM:
LIMITED OBSTETRIC ULTRASOUND

[Series 1: limited obstetric ultrasound · 14 of 21 slices shown]
[im 1/21]
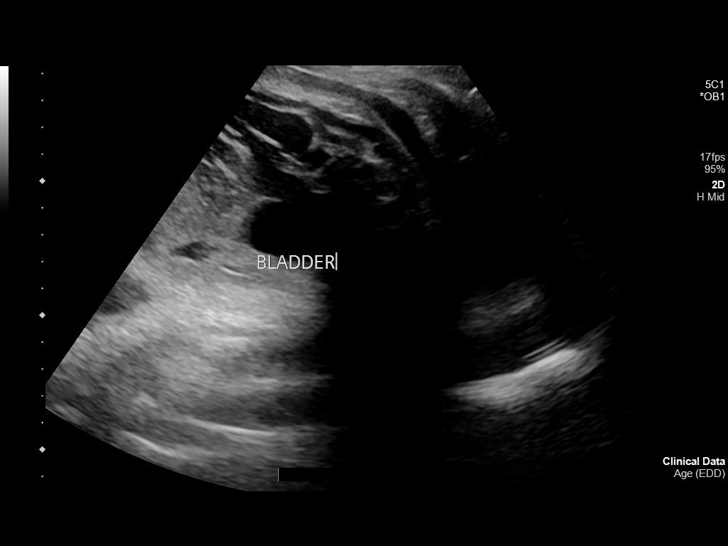
[im 3/21]
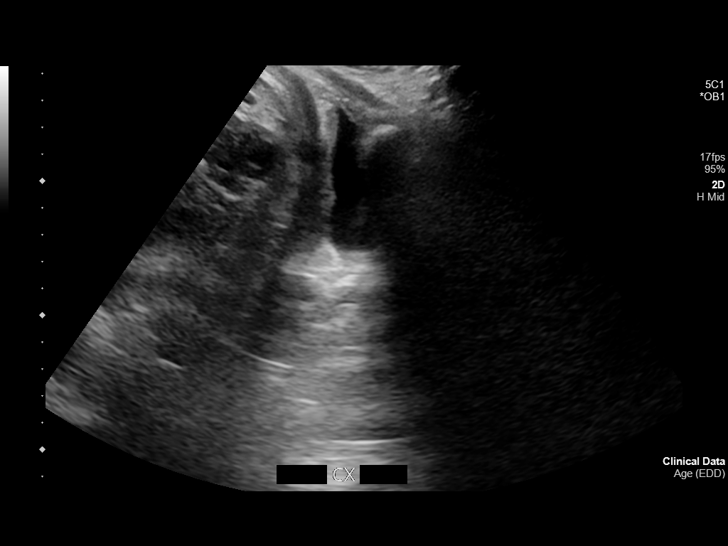
[im 4/21]
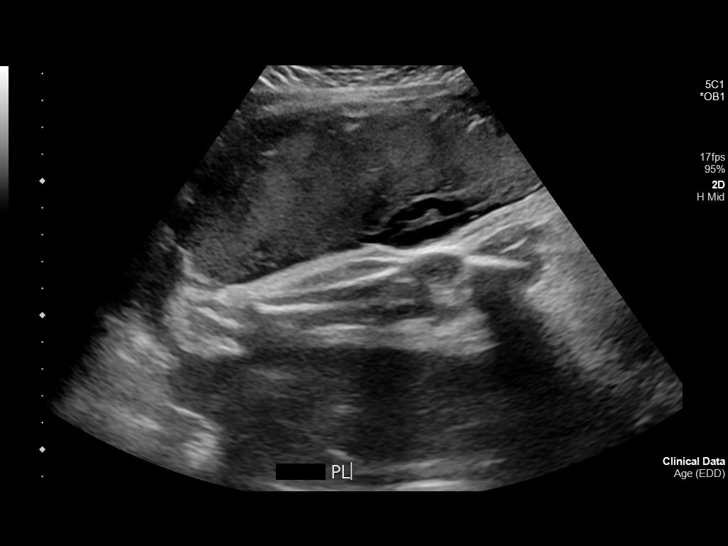
[im 6/21]
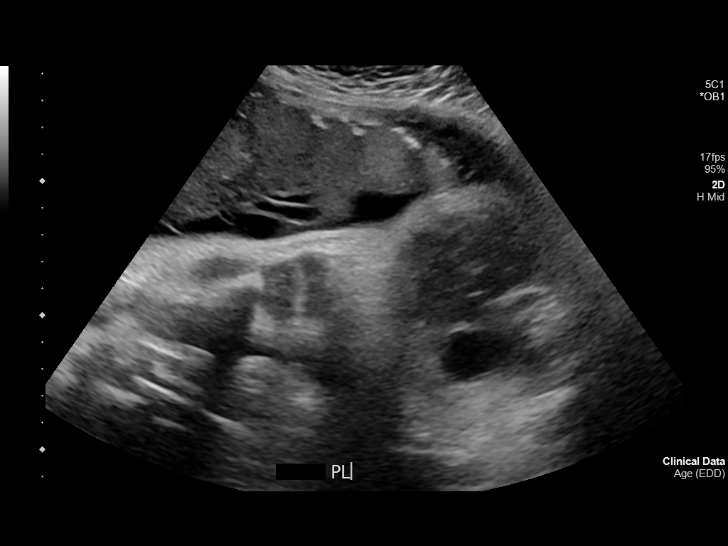
[im 7/21]
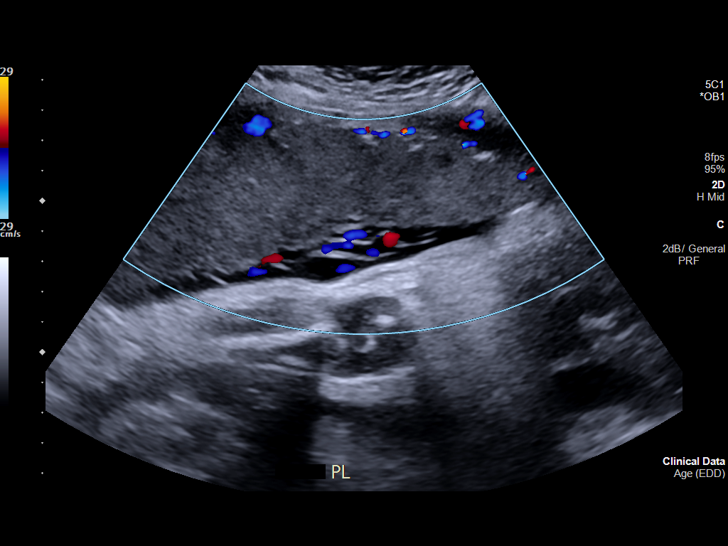
[im 9/21]
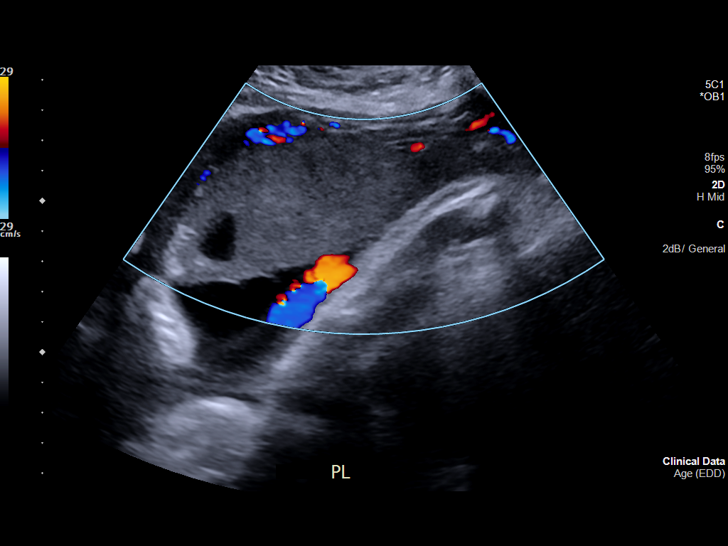
[im 10/21]
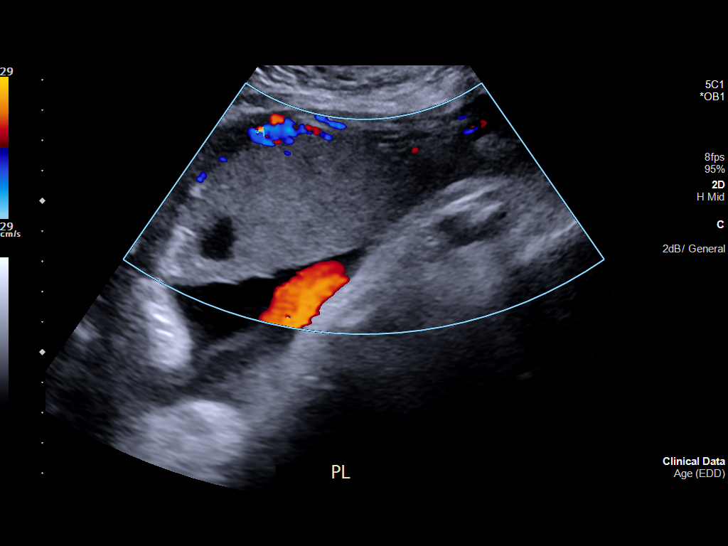
[im 12/21]
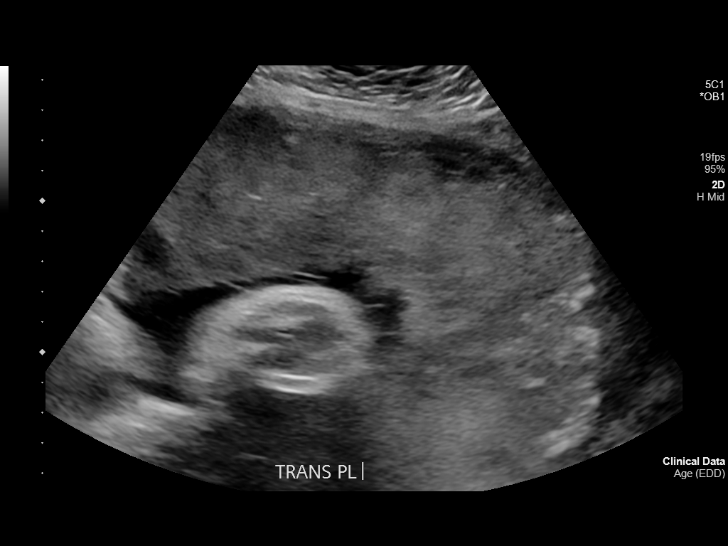
[im 13/21]
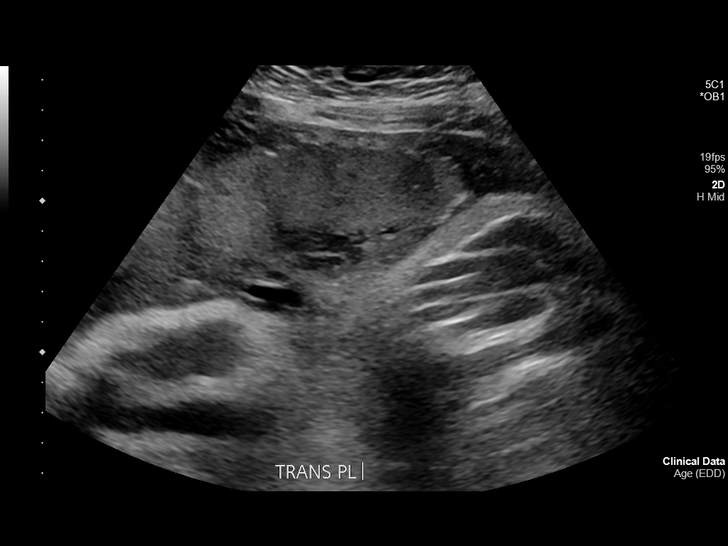
[im 15/21]
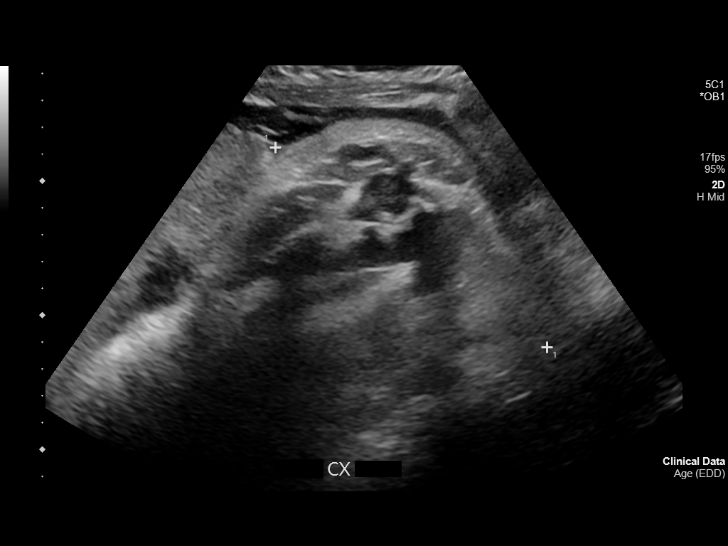
[im 16/21]
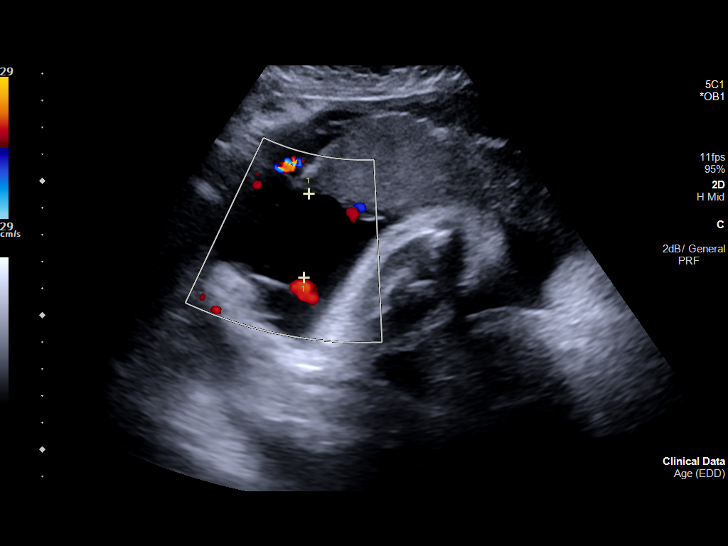
[im 18/21]
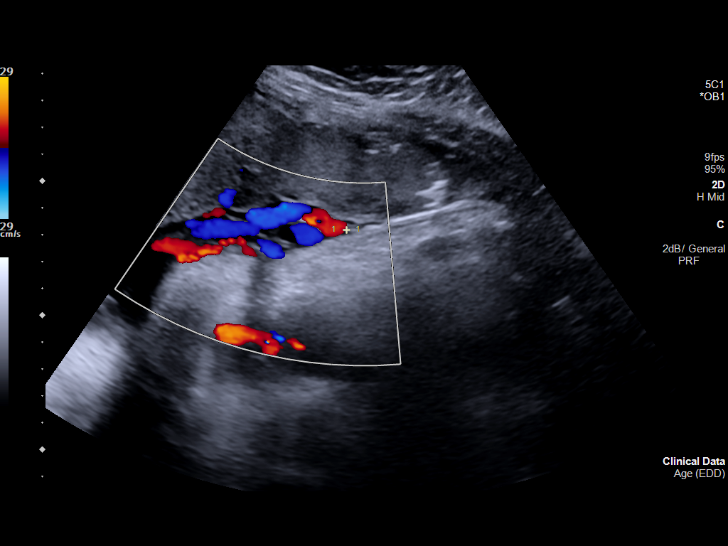
[im 19/21]
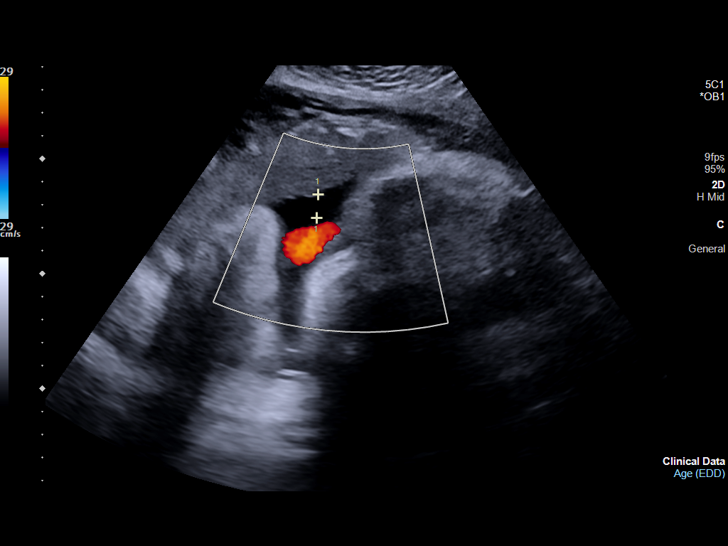
[im 21/21]
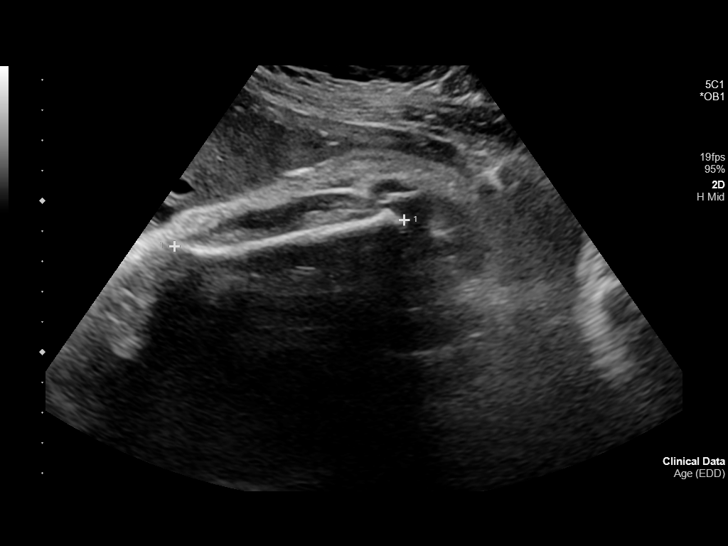

[14 of 21 positions shown; findings below may reference images not displayed]

FINDINGS: Number of Fetuses: 1

Heart Rate:  123 bpm

Movement: Present

Presentation: Breech

Placental Location: Anterior

Previa: No

Amniotic Fluid (Subjective):  Low

AFI: 5.2 cm.  Similar finding noted on prior exam of 12/06/2018.

Femur length 7.6 cm gestational age 39 weeks 0 days.

MATERNAL FINDINGS:

Cervix:  Not visualized

Uterus/Adnexae: No abnormality visualized.
IMPRESSION: 1. Single viable intrauterine pregnancy at 39 weeks 0 days. Fetal
heart rate 123 beats per minute. Breech presentation.

2.  Amniotic fluid is subjectively low.  AFI at 5.2 cm.

These results will be called to the ordering clinician or
representative by the Radiologist Assistant, and communication
documented in the PACS or zVision Dashboard.

This exam is performed on an emergent basis and does not
comprehensively evaluate fetal size, dating, or anatomy; follow-up
complete OB US should be considered if further fetal assessment is
warranted.

## 2021-11-06 IMAGING — CR DG CERVICAL SPINE 2 OR 3 VIEWS
1 series · 3 of 3 positions shown · non-contrast
Comparison: None.

CLINICAL DATA: Next spasms. Posterior neck pain radiating down the
left arm

EXAM:
CERVICAL SPINE - 2-3 VIEW

[Series 1: dg cervical spine 2 or 3 views · 0.14mm/px · 3 of 3 slices shown]
[im 1/3]
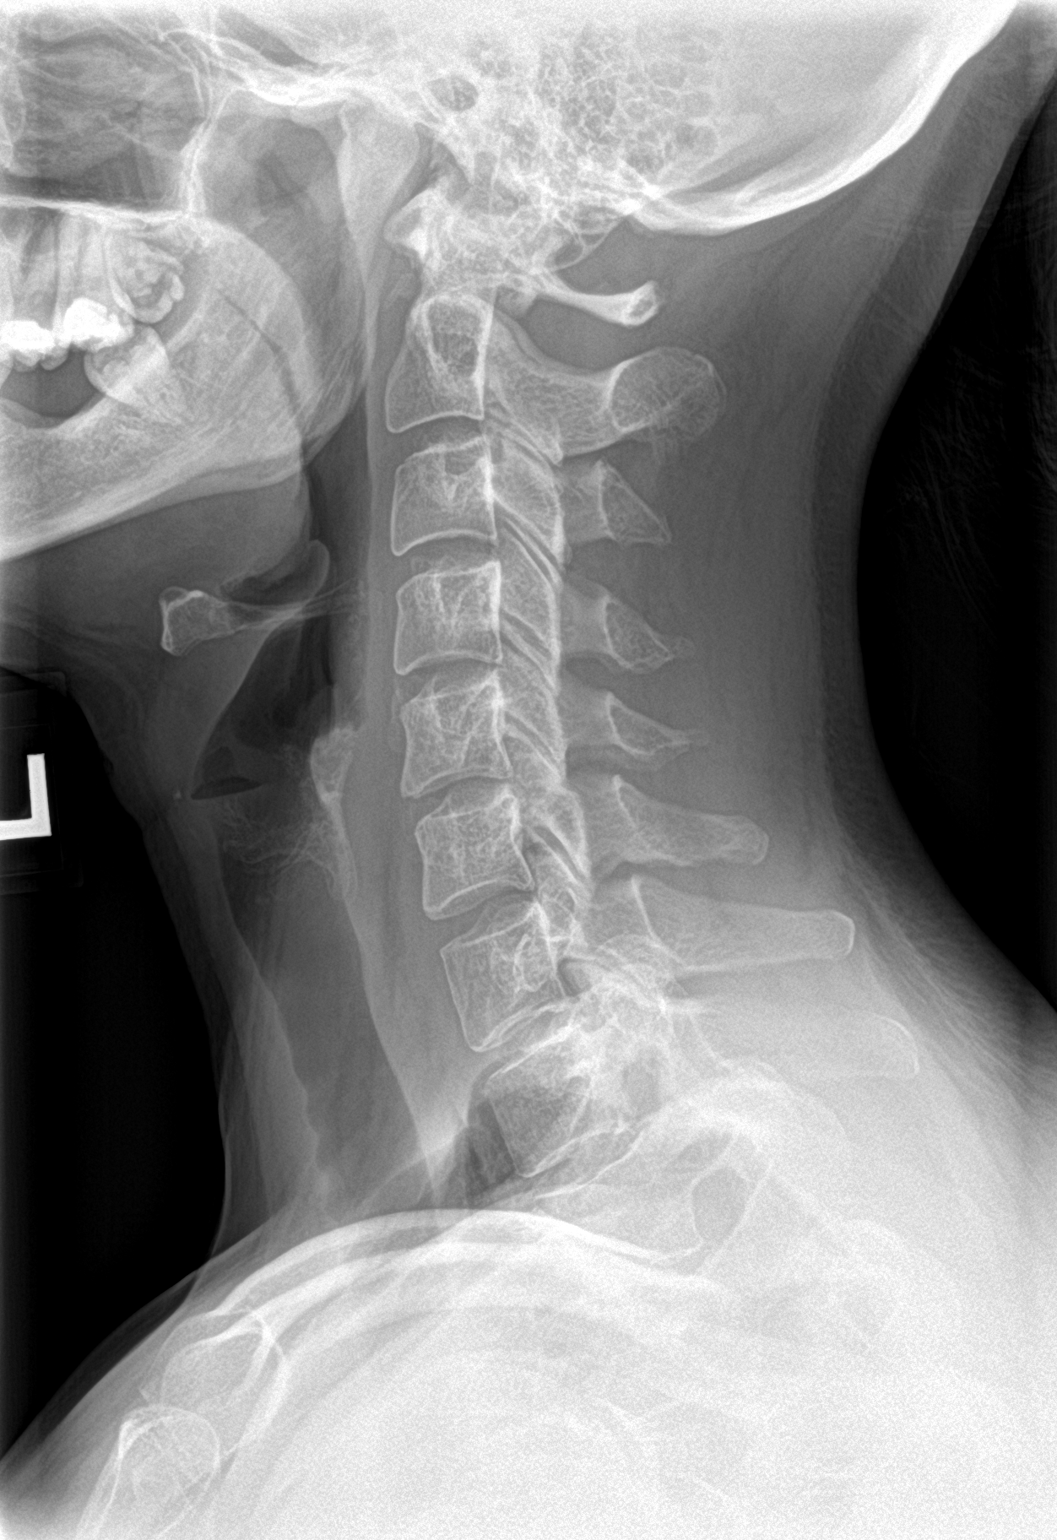
[im 2/3]
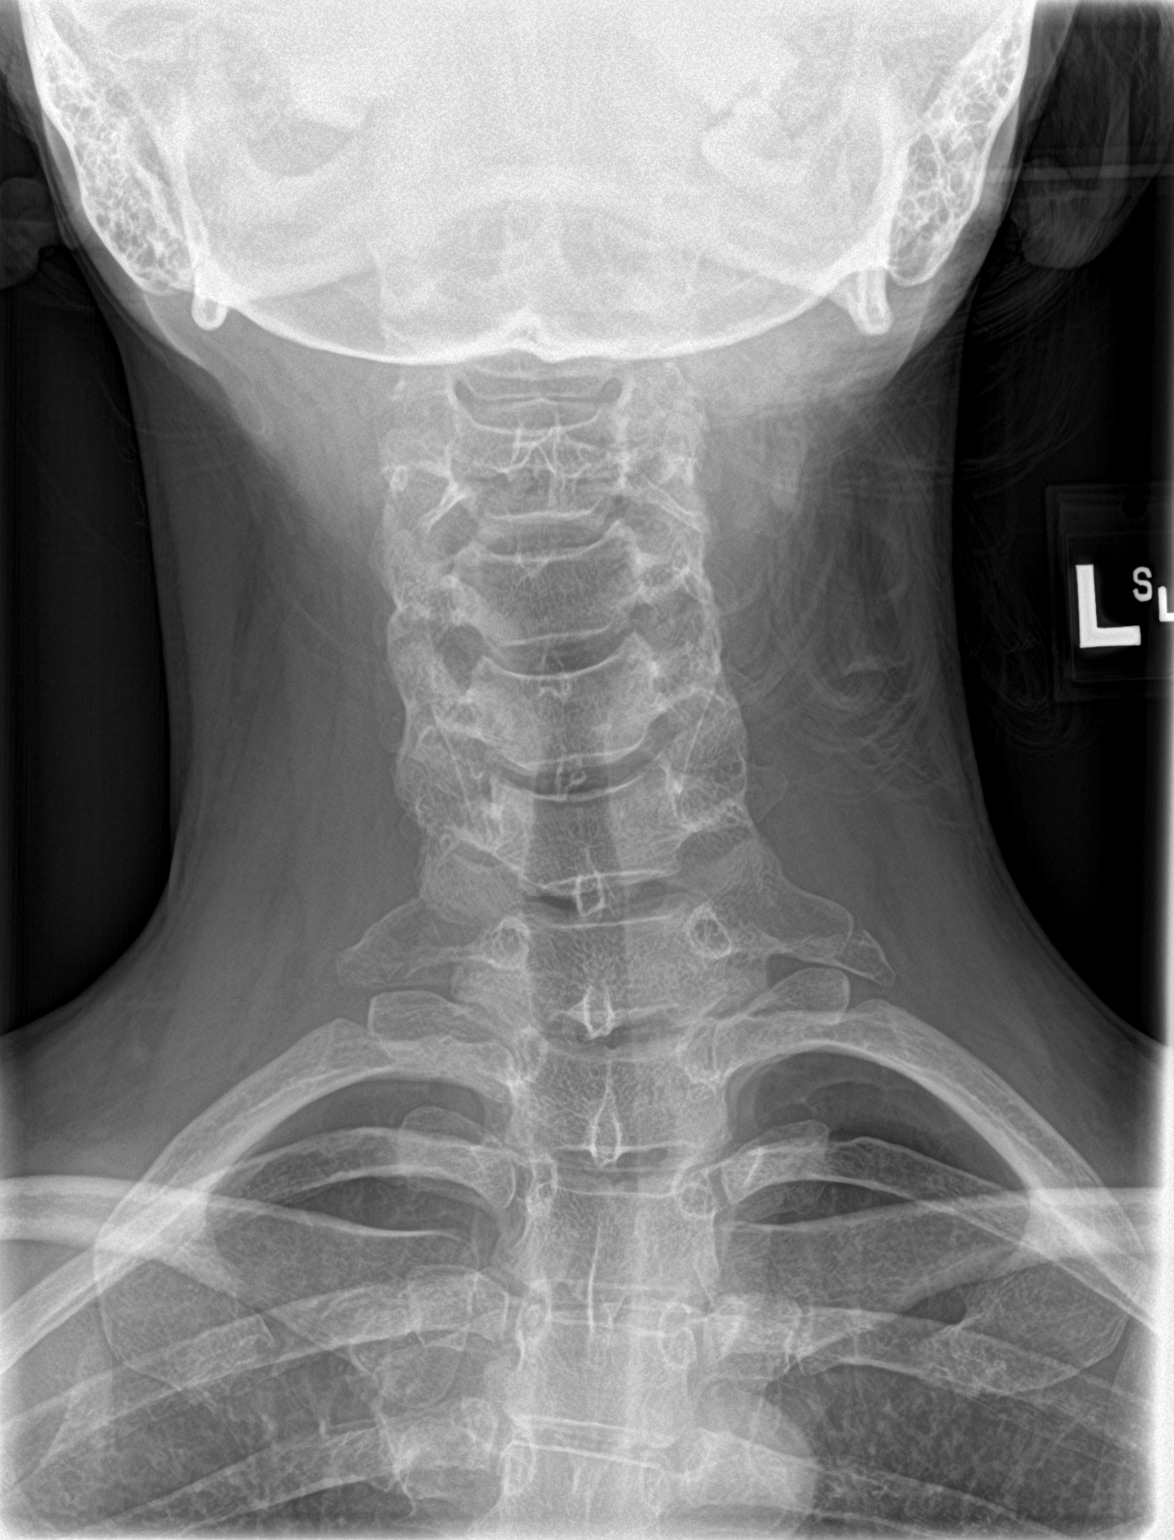
[im 3/3]
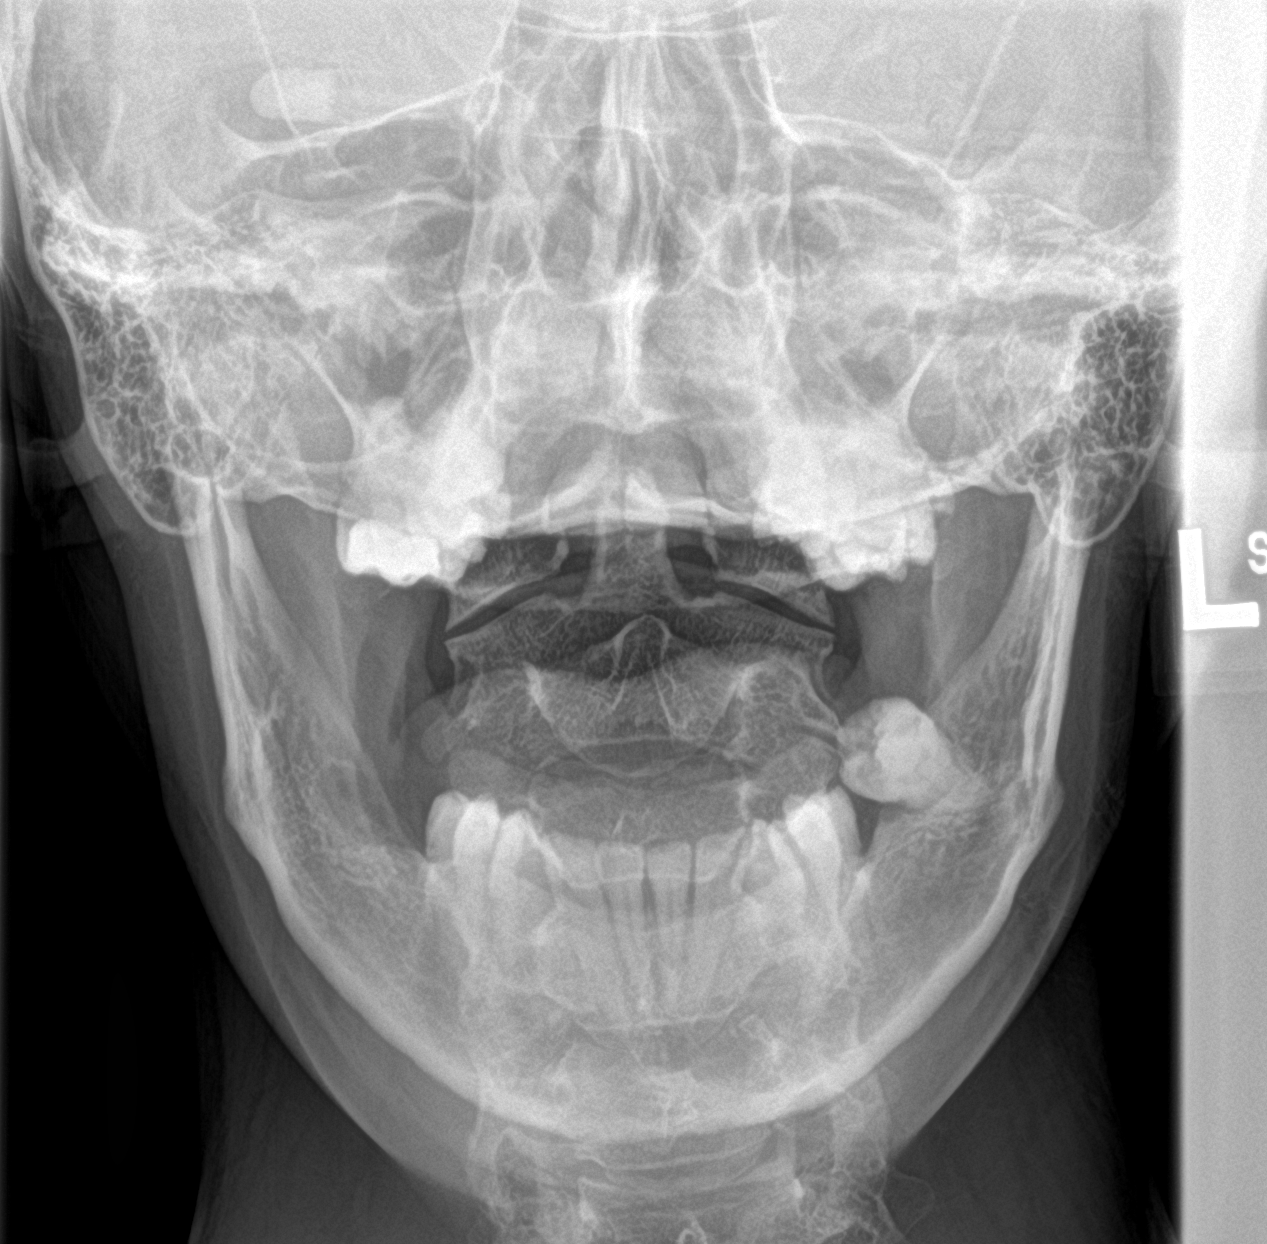

[3 of 3 positions shown; findings below may reference images not displayed]

FINDINGS: No fracture, bone lesion or spondylolisthesis. Discs are well
maintained in height. No degenerative change.

Cervical spine is mildly curved, convex to the right, apex at L3-L4,
which may be positional.

Soft tissues are unremarkable.
IMPRESSION: 1. Slight curvature the cervical spine, convex the right, which may
be positional or be due to muscle spasm.
2. Exam otherwise within normal limits.
